# Patient Record
Sex: Female | Born: 1975 | Race: White | Hispanic: No | Smoking: Current every day smoker
Health system: Southern US, Community
[De-identification: ages and names within clinical notes are randomized; demographics above are authoritative.]

## PROBLEM LIST (undated history)

## (undated) DIAGNOSIS — Z22322 Carrier or suspected carrier of Methicillin resistant Staphylococcus aureus: Secondary | ICD-10-CM

## (undated) HISTORY — PX: APPENDECTOMY: SHX54

---

## 2007-04-29 ENCOUNTER — Inpatient Hospital Stay: Payer: Self-pay | Admitting: Internal Medicine

## 2007-11-13 ENCOUNTER — Inpatient Hospital Stay (HOSPITAL_COMMUNITY): Admission: EM | Admit: 2007-11-13 | Discharge: 2007-11-17 | Payer: Self-pay | Admitting: Emergency Medicine

## 2008-01-28 ENCOUNTER — Encounter: Admission: RE | Admit: 2008-01-28 | Discharge: 2008-01-28 | Payer: Self-pay | Admitting: Orthopedic Surgery

## 2010-04-06 ENCOUNTER — Emergency Department (HOSPITAL_COMMUNITY): Admission: EM | Admit: 2010-04-06 | Discharge: 2010-04-06 | Payer: Self-pay | Admitting: Emergency Medicine

## 2010-04-10 ENCOUNTER — Emergency Department (HOSPITAL_COMMUNITY): Admission: EM | Admit: 2010-04-10 | Discharge: 2010-04-10 | Payer: Self-pay | Admitting: Emergency Medicine

## 2010-09-27 LAB — DIFFERENTIAL
Basophils Absolute: 0 10*3/uL (ref 0.0–0.1)
Basophils Relative: 0 % (ref 0–1)
Eosinophils Absolute: 0.3 10*3/uL (ref 0.0–0.7)
Eosinophils Relative: 5 % (ref 0–5)
Lymphocytes Relative: 25 % (ref 12–46)

## 2010-09-27 LAB — URINALYSIS, ROUTINE W REFLEX MICROSCOPIC
Ketones, ur: NEGATIVE mg/dL
Leukocytes, UA: NEGATIVE
Nitrite: NEGATIVE
Protein, ur: NEGATIVE mg/dL
Urobilinogen, UA: 0.2 mg/dL (ref 0.0–1.0)

## 2010-09-27 LAB — BASIC METABOLIC PANEL
CO2: 22 mEq/L (ref 19–32)
GFR calc Af Amer: >60 mL/min (ref >60)
Glucose, Bld: 104 mg/dL — ABNORMAL HIGH (ref 70–99)
Potassium: 3.6 mEq/L (ref 3.5–5.1)
Sodium: 141 mEq/L (ref 135–145)

## 2010-09-27 LAB — CBC
MCHC: 35.8 g/dL (ref 30.0–36.0)
MCV: 88 fL (ref 78.0–100.0)
Platelets: 196 10*3/uL (ref 150–400)
RDW: 13.1 % (ref 11.5–15.5)
WBC: 6.6 10*3/uL (ref 4.0–10.5)
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
## 2011-07-10 ENCOUNTER — Observation Stay (HOSPITAL_COMMUNITY)
Admission: EM | Admit: 2011-07-10 | Discharge: 2011-07-11 | Disposition: A | Discharge status: Home / Self Care | Payer: Medicaid Other | Attending: Emergency Medicine | Admitting: Emergency Medicine

## 2011-07-10 ENCOUNTER — Encounter: Payer: Self-pay | Admitting: Emergency Medicine

## 2011-07-10 DIAGNOSIS — F17200 Nicotine dependence, unspecified, uncomplicated: Secondary | ICD-10-CM | POA: Insufficient documentation

## 2011-07-10 DIAGNOSIS — L02219 Cutaneous abscess of trunk, unspecified: Principal | ICD-10-CM | POA: Insufficient documentation

## 2011-07-10 DIAGNOSIS — L0390 Cellulitis, unspecified: Secondary | ICD-10-CM

## 2011-07-10 DIAGNOSIS — A4902 Methicillin resistant Staphylococcus aureus infection, unspecified site: Secondary | ICD-10-CM | POA: Insufficient documentation

## 2011-07-10 HISTORY — DX: Carrier or suspected carrier of methicillin resistant Staphylococcus aureus: Z22.322

## 2011-07-10 NOTE — ED Notes (Signed)
PT. REPORTS PROGRESSING ABSCESS AT UPPER BACK FOR 4 DAYS WITH NO DRAINAGE. , DENIES FEVER OR CHILLS.

## 2011-07-11 LAB — BASIC METABOLIC PANEL
BUN: 10 mg/dL (ref 6–23)
Calcium: 9.5 mg/dL (ref 8.4–10.5)
Creatinine, Ser: 0.49 mg/dL — ABNORMAL LOW (ref 0.50–1.10)
GFR calc Af Amer: >90 mL/min (ref >90)
GFR calc non Af Amer: >90 mL/min (ref >90)

## 2011-07-11 LAB — CBC
HCT: 40.2 % (ref 36.0–46.0)
Hemoglobin: 14.3 g/dL (ref 12.0–15.0)
MCH: 31.8 pg (ref 26.0–34.0)
MCHC: 35.6 g/dL (ref 30.0–36.0)
RBC: 4.5 MIL/uL (ref 3.87–5.11)

## 2011-07-11 MED ORDER — DIPHENHYDRAMINE HCL 50 MG/ML IJ SOLN
INTRAMUSCULAR | Status: AC
  Administered 2011-07-11: 05:00:00
  Filled 2011-07-11: qty 1

## 2011-07-11 MED ORDER — HYDROMORPHONE HCL PF 1 MG/ML IJ SOLN
0.5000 mg | Freq: Once | INTRAMUSCULAR | Status: AC
  Administered 2011-07-11: 0.5 mg via INTRAVENOUS
  Filled 2011-07-11: qty 1

## 2011-07-11 MED ORDER — CEPHALEXIN 500 MG PO CAPS: 500.0000 mg | ORAL_CAPSULE | Freq: Four times a day (QID) | ORAL | Status: AC

## 2011-07-11 MED ORDER — SODIUM CHLORIDE 0.9 % IV SOLN
20.0000 mL | INTRAVENOUS | Status: DC
  Administered 2011-07-11: 20 mL via INTRAVENOUS

## 2011-07-11 MED ORDER — ONDANSETRON 4 MG PO TBDP
4.0000 mg | ORAL_TABLET | Freq: Once | ORAL | Status: AC
  Administered 2011-07-11: 4 mg via ORAL
  Filled 2011-07-11: qty 1

## 2011-07-11 MED ORDER — ONDANSETRON HCL 4 MG/2ML IJ SOLN
4.0000 mg | Freq: Four times a day (QID) | INTRAMUSCULAR | Status: DC | PRN
  Administered 2011-07-11: 4 mg via INTRAVENOUS
  Filled 2011-07-11: qty 2

## 2011-07-11 MED ORDER — SODIUM CHLORIDE 0.9 % IV SOLN
Freq: Once | INTRAVENOUS | Status: AC
  Administered 2011-07-11: 15:00:00 via INTRAVENOUS

## 2011-07-11 MED ORDER — VANCOMYCIN HCL IN DEXTROSE 1-5 GM/200ML-% IV SOLN
1000.0000 mg | Freq: Two times a day (BID) | INTRAVENOUS | Status: DC
  Administered 2011-07-11 (×2): 1000 mg via INTRAVENOUS
  Filled 2011-07-11 (×2): qty 200

## 2011-07-11 MED ORDER — OXYCODONE-ACETAMINOPHEN 5-325 MG PO TABS: 1.0000 | ORAL_TABLET | Freq: Four times a day (QID) | ORAL | Status: AC | PRN

## 2011-07-11 MED ORDER — FENTANYL CITRATE 0.05 MG/ML IJ SOLN
50.0000 ug | INTRAMUSCULAR | Status: DC | PRN
  Administered 2011-07-11 (×3): 50 ug via INTRAVENOUS
  Filled 2011-07-11 (×3): qty 2

## 2011-07-11 MED ORDER — DIPHENHYDRAMINE HCL 50 MG/ML IJ SOLN
INTRAMUSCULAR | Status: AC
  Filled 2011-07-11: qty 1

## 2011-07-11 MED ORDER — DIPHENHYDRAMINE HCL 50 MG/ML IJ SOLN
25.0000 mg | Freq: Once | INTRAMUSCULAR | Status: AC
  Administered 2011-07-11: 25 mg via INTRAVENOUS

## 2011-07-11 MED ORDER — SULFAMETHOXAZOLE-TRIMETHOPRIM 800-160 MG PO TABS: 1.0000 | ORAL_TABLET | Freq: Two times a day (BID) | ORAL | Status: AC

## 2011-07-11 NOTE — ED Notes (Signed)
Lunch tray to pt.

## 2011-07-11 NOTE — ED Notes (Signed)
Per Langley Adie, PA, administer Benadryl 25mg  IV prior to administration of Vancomycin.

## 2011-07-11 NOTE — ED Provider Notes (Signed)
She is resting. Went over care plan to observe until second dose antibiotics around 3:00 this afternoon. She understands and agrees.   13:15: sHE HAS BEEN MODERATELY CONTROLLED PAIN WISE WITH FENTANYL BUT NEVER PAIN FREE. WILL GIVE IV DILAUDID AND THEN TRANSITION TO ORAL PAIN MEDICATIONS. PATIENT AWARE OF PLAN. CELLULITIC AREA IMPROVED WITH LESS REDNESS.   Rodena Medin, PA 07/11/11 1410

## 2011-07-11 NOTE — Progress Notes (Signed)
Observation review is complete. 

## 2011-07-11 NOTE — Progress Notes (Signed)
Per Dr. Jacky Kindle on 07/11/2011 at 11:15am, patient meets criteria for observation status.

## 2011-07-11 NOTE — ED Provider Notes (Addendum)
History     CSN: [id number]  Arrival date & time 07/10/11  2104   First MD Initiated Contact with Patient 07/11/11 0247      Chief Complaint  Patient presents with  . Abscess    (Consider location/radiation/quality/duration/timing/severity/associated sxs/prior treatment) HPI This is a 35 year old white female with a history of MRSA infection. She is here with an tender swollen area intermittent upper back. It began about 4 days ago and has gotten progressively larger. There is moderate to severe pain associated with it, worse with movement or palpation. There is some erythema. There's been minimal drainage. There's been no fevers or chills. There's been no nausea or vomiting but she has had diarrhea. She is placed salt on it and attempted drawnout without success.  Past Medical History  Diagnosis Date  . MRSA (methicillin resistant staph aureus) culture positive     Past Surgical History  Procedure Date  . Appendectomy   . Cesarean section     No family history on file.  History  Substance Use Topics  . Smoking status: Current Everyday Smoker  . Smokeless tobacco: Not on file  . Alcohol Use: Yes    OB History    Grav Para Term Preterm Abortions TAB SAB Ect Mult Living                  Review of Systems  All other systems reviewed and are negative.    Allergies  Codeine; Hydrocodone; and Iohexol  Home Medications   Current Outpatient Rx  Name Route Sig Dispense Refill  . ASPIRIN-ACETAMINOPHEN-CAFFEINE 250-250-65 MG PO TABS Oral Take 1 tablet by mouth every 6 (six) hours as needed. For menstrual cramps     . DOXYLAMINE SUCCINATE (SLEEP) 25 MG PO TABS Oral Take 25 mg by mouth at bedtime as needed. For sleep       BP 113/61  Pulse 89  Temp(Src) 98.5 F (36.9 C) (Oral)  Resp 16  SpO2 98%  LMP 06/19/2011  Physical Exam General: Well-developed, well-nourished female in no acute distress; appearance consistent with age of record HENT: normocephalic,  atraumatic Eyes: pupils equal round and reactive to light; extraocular muscles intact Neck: supple Heart: regular rate and rhythm Lungs: clear to auscultation bilaterally Abdomen: soft; nontender; nondistended Extremities: No deformity; full range of motion Neurologic: Awake, alert and oriented; motor function intact in all extremities and symmetric; no facial droop; normal coordination speech Skin: Tender, indurated area mid upper back just to the right of the spine; there is a central punctum without drainage; there is no fluctuance; there is mild erythema extending approximately 8 x 10 cm; no pus pockets seen on bedside Psychiatric: Normal mood and affect    ED Course  Procedures (including critical care time)    MDM   We'll start vancomycin in place in CDU on cellulitis protocol.  Nursing notes and vitals signs, including pulse oximetry, reviewed.  Summary of this visit's results, reviewed by myself:  Labs:  Results for orders placed during the hospital encounter of 07/10/11  CBC      Component Value Range   WBC 13.5 (*) 4.0 - 10.5 (K/uL)   RBC 4.50  3.87 - 5.11 (MIL/uL)   Hemoglobin 14.3  12.0 - 15.0 (g/dL)   HCT 78.2  95.6 - 21.3 (%)   MCV 89.3  78.0 - 100.0 (fL)   MCH 31.8  26.0 - 34.0 (pg)   MCHC 35.6  30.0 - 36.0 (g/dL)   RDW 08.6  57.8 -  15.5 (%)   Platelets 179  150 - 400 (K/uL)  BASIC METABOLIC PANEL      Component Value Range   Sodium 137  135 - 145 (mEq/L)   Potassium 3.6  3.5 - 5.1 (mEq/L)   Chloride 100  96 - 112 (mEq/L)   CO2 25  19 - 32 (mEq/L)   Glucose, Bld 97  70 - 99 (mg/dL)   BUN 10  6 - 23 (mg/dL)   Creatinine, Ser 4.09 (*) 0.50 - 1.10 (mg/dL)   Calcium 9.5  8.4 - 81.1 (mg/dL)   GFR calc non Af Amer >90  >90 (mL/min)   GFR calc Af Amer >90  >90 (mL/min)         Hanley Seamen, MD 07/11/11 9147  Hanley Seamen, MD 07/11/11 8295

## 2011-07-11 NOTE — ED Notes (Signed)
Breakfast tray ordered 

## 2011-07-12 NOTE — ED Provider Notes (Signed)
Medical screening examination/treatment/procedure(s) were performed by non-physician practitioner and as supervising physician I was immediately available for consultation/collaboration. Hadlei Stitt Y.   Gavin Pound. Oletta Lamas, MD 07/12/11 [phone]

## 2012-08-03 ENCOUNTER — Emergency Department (HOSPITAL_COMMUNITY): Payer: Medicaid Other

## 2012-08-03 ENCOUNTER — Other Ambulatory Visit: Payer: Self-pay

## 2012-08-03 ENCOUNTER — Emergency Department (HOSPITAL_COMMUNITY)
Admission: EM | Admit: 2012-08-03 | Discharge: 2012-08-03 | Disposition: A | Discharge status: Home / Self Care | Payer: Medicaid Other | Attending: Emergency Medicine | Admitting: Emergency Medicine

## 2012-08-03 ENCOUNTER — Encounter (HOSPITAL_COMMUNITY): Payer: Self-pay | Admitting: Family Medicine

## 2012-08-03 DIAGNOSIS — Z8614 Personal history of Methicillin resistant Staphylococcus aureus infection: Secondary | ICD-10-CM | POA: Insufficient documentation

## 2012-08-03 DIAGNOSIS — M940 Chondrocostal junction syndrome [Tietze]: Secondary | ICD-10-CM | POA: Insufficient documentation

## 2012-08-03 DIAGNOSIS — F17200 Nicotine dependence, unspecified, uncomplicated: Secondary | ICD-10-CM | POA: Insufficient documentation

## 2012-08-03 DIAGNOSIS — R0789 Other chest pain: Secondary | ICD-10-CM | POA: Insufficient documentation

## 2012-08-03 DIAGNOSIS — R509 Fever, unspecified: Secondary | ICD-10-CM | POA: Insufficient documentation

## 2012-08-03 DIAGNOSIS — Z79899 Other long term (current) drug therapy: Secondary | ICD-10-CM | POA: Insufficient documentation

## 2012-08-03 DIAGNOSIS — R0602 Shortness of breath: Secondary | ICD-10-CM | POA: Insufficient documentation

## 2012-08-03 DIAGNOSIS — R110 Nausea: Secondary | ICD-10-CM | POA: Insufficient documentation

## 2012-08-03 LAB — BASIC METABOLIC PANEL
BUN: 9 mg/dL (ref 6–23)
GFR calc Af Amer: >90 mL/min (ref >90)
GFR calc non Af Amer: >90 mL/min (ref >90)
Potassium: 3.4 mEq/L — ABNORMAL LOW (ref 3.5–5.1)
Sodium: 140 mEq/L (ref 135–145)

## 2012-08-03 LAB — CBC
HCT: 37.2 % (ref 36.0–46.0)
MCHC: 36 g/dL (ref 30.0–36.0)
Platelets: 197 10*3/uL (ref 150–400)
RDW: 13.1 % (ref 11.5–15.5)
WBC: 9 10*3/uL (ref 4.0–10.5)

## 2012-08-03 LAB — POCT I-STAT TROPONIN I: Troponin i, poc: 0 ng/mL (ref 0.00–0.08)

## 2012-08-03 MED ORDER — KETOROLAC TROMETHAMINE 60 MG/2ML IM SOLN
60.0000 mg | Freq: Once | INTRAMUSCULAR | Status: AC
  Administered 2012-08-03: 60 mg via INTRAMUSCULAR
  Filled 2012-08-03: qty 2

## 2012-08-03 NOTE — ED Provider Notes (Signed)
History     CSN: [id number]  Arrival date & time 08/03/12  1409   First MD Initiated Contact with Patient 08/03/12 1507      Chief Complaint  Patient presents with  . chest swelling     (Consider location/radiation/quality/duration/timing/severity/associated sxs/prior treatment) HPI Comments: 37 year old female presents to the emergency department complaining of "chest swelling" worsening over the past month. States she had a cold about one month ago which has since subsided on its own, however she has had recurrent "chest swelling" in pain intermittently since. Describes the pain as a "pressure as if someone is sitting on her chest" rated 3/10, worse with doing housework. Pain located in the midsternal region radiating to her right shoulder and arm today. Admits to associated shortness of breath on exertion yesterday and today along with fever and chills. Herbal teas provide no relief. 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 She quit smoking a little over a month ago. Her mom recently passed away a heart attack at age 9, and her heart disease was diagnosed before the age of 77. Patient denies any significant past medical history. No recent long car rides or plane rides   The history is provided by the patient.    Past Medical History  Diagnosis Date  . MRSA (methicillin resistant staph aureus) culture positive     Past Surgical History  Procedure Date  . Appendectomy   . Cesarean section     History reviewed. No pertinent family history.  History  Substance Use Topics  . Smoking status: Current Every Day Smoker  . Smokeless tobacco: Not on file  . Alcohol Use: Yes    OB History    Grav Para Term Preterm Abortions TAB SAB Ect Mult Living                  Review of Systems  Constitutional: Positive for fever and chills.  HENT: Negative for congestion, neck pain and neck stiffness.   Eyes: Negative for visual disturbance.    Respiratory: Positive for chest tightness and shortness of breath.   Cardiovascular: Positive for chest pain. Negative for palpitations and leg swelling.  Gastrointestinal: Positive for nausea. Negative for abdominal pain.  Genitourinary: Negative.   Musculoskeletal: Negative for back pain.  Skin: Negative for rash.  Neurological: Negative for weakness.  Psychiatric/Behavioral: Negative for confusion.    Allergies  Codeine; Hydrocodone; and Iohexol  Home Medications   Current Outpatient Rx  Name  Route  Sig  Dispense  Refill  . PAMPRIN MAX PO   Oral   Take 1 tablet by mouth 2 (two) times daily as needed. For cramps         . VITAMIN C 500 MG PO TABS   Oral   Take 500 mg by mouth daily.         Marland Kitchen ZINC 100 MG PO TABS   Oral   Take 1 tablet by mouth daily.           BP 134/95  Pulse 54  Temp 98 F (36.7 C) (Oral)  Resp 15  SpO2 98%  LMP 08/03/2012  Physical Exam  Nursing note and vitals reviewed. Constitutional: She is oriented to person, place, and time. She appears well-developed.       Overweight  HENT:  Head: Normocephalic and atraumatic.  Mouth/Throat: Oropharynx is clear and moist.  Eyes: Conjunctivae normal and EOM are normal. Pupils are equal, round, and  reactive to light.  Neck: Normal range of motion. Neck supple.  Cardiovascular: Normal rate, regular rhythm, normal heart sounds and intact distal pulses.        No extremity edema.  Pulmonary/Chest: Effort normal and breath sounds normal. She has no wheezes. She has no rhonchi. She has no rales. She exhibits tenderness. She exhibits no edema.    Abdominal: Soft. Bowel sounds are normal. There is no tenderness.  Musculoskeletal: Normal range of motion. She exhibits no edema.  Neurological: She is alert and oriented to person, place, and time.  Skin: Skin is warm and dry. No rash noted.  Psychiatric: She has a normal mood and affect. Her behavior is normal.    ED Course  Procedures (including  critical care time)  Labs Reviewed  BASIC METABOLIC PANEL - Abnormal; Notable for the following:    Potassium 3.4 (*)     All other components within normal limits  CBC  POCT I-STAT TROPONIN I   Dg Chest 2 View  08/03/2012  *RADIOLOGY REPORT*  Clinical Data: Cough and congestion with intermittent fever and difficulty breathing.  CHEST - 2 VIEW  Comparison: None.  Findings: Trachea is midline.  Heart size normal.  Lungs are clear. No pleural fluid.  IMPRESSION: No acute findings.   Original Report Authenticated By: Leanna Battles, M.D.      Date: 08/03/2012  Rate: 64  Rhythm: normal sinus rhythm  QRS Axis: normal  Intervals: normal  ST/T Wave abnormalities: normal  Conduction Disutrbances:none  Narrative Interpretation: sinus rhythm, no stemi  Old EKG Reviewed: none available   1. Costochondritis       MDM  37 y/o female with costochondritis. Chest tenderness to palpation. PERC negative- no concern for PE. Cardiac workup performed due to nature of pain and family hx which was negative. 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 EKG and CXR unremarkable. Pain to palpation improved with toradol. Advised ibuprofen, rest and ice. Return precautions discussed. Patient states understanding of plan and is agreeable.         Trevor Mace, PA-C 08/03/12 1658

## 2012-08-03 NOTE — ED Provider Notes (Signed)
Medical screening examination/treatment/procedure(s) were performed by non-physician practitioner and as supervising physician I was immediately available for consultation/collaboration.   Richardean Canal, MD 08/03/12 [phone]

## 2012-08-03 NOTE — ED Notes (Addendum)
Per pt sts chest swelling that has been going on for a couple of months. sts that it is spreading and getting worse causing pain. sts she is week and doing any normal household routine makes her exhausted. sts when she walks she gets lightheaded.

## 2013-02-24 ENCOUNTER — Encounter (HOSPITAL_COMMUNITY): Payer: Self-pay | Admitting: Emergency Medicine

## 2013-02-24 ENCOUNTER — Emergency Department (HOSPITAL_COMMUNITY)
Admission: EM | Admit: 2013-02-24 | Discharge: 2013-02-24 | Disposition: A | Discharge status: Home / Self Care | Payer: Medicaid Other | Attending: Emergency Medicine | Admitting: Emergency Medicine

## 2013-02-24 DIAGNOSIS — Y939 Activity, unspecified: Secondary | ICD-10-CM | POA: Insufficient documentation

## 2013-02-24 DIAGNOSIS — T6391XA Toxic effect of contact with unspecified venomous animal, accidental (unintentional), initial encounter: Secondary | ICD-10-CM | POA: Insufficient documentation

## 2013-02-24 DIAGNOSIS — L299 Pruritus, unspecified: Secondary | ICD-10-CM | POA: Insufficient documentation

## 2013-02-24 DIAGNOSIS — Y929 Unspecified place or not applicable: Secondary | ICD-10-CM | POA: Insufficient documentation

## 2013-02-24 DIAGNOSIS — R21 Rash and other nonspecific skin eruption: Secondary | ICD-10-CM | POA: Insufficient documentation

## 2013-02-24 DIAGNOSIS — W57XXXA Bitten or stung by nonvenomous insect and other nonvenomous arthropods, initial encounter: Secondary | ICD-10-CM

## 2013-02-24 DIAGNOSIS — F17200 Nicotine dependence, unspecified, uncomplicated: Secondary | ICD-10-CM | POA: Insufficient documentation

## 2013-02-24 DIAGNOSIS — Z8614 Personal history of Methicillin resistant Staphylococcus aureus infection: Secondary | ICD-10-CM | POA: Insufficient documentation

## 2013-02-24 DIAGNOSIS — R110 Nausea: Secondary | ICD-10-CM | POA: Insufficient documentation

## 2013-02-24 DIAGNOSIS — T7840XA Allergy, unspecified, initial encounter: Secondary | ICD-10-CM

## 2013-02-24 DIAGNOSIS — IMO0001 Reserved for inherently not codable concepts without codable children: Secondary | ICD-10-CM | POA: Insufficient documentation

## 2013-02-24 DIAGNOSIS — Z79899 Other long term (current) drug therapy: Secondary | ICD-10-CM | POA: Insufficient documentation

## 2013-02-24 MED ORDER — EPINEPHRINE 0.3 MG/0.3ML IJ SOAJ: 0.3000 mg | INTRAMUSCULAR | Status: AC | PRN

## 2013-02-24 NOTE — ED Notes (Signed)
Pt given d/c teaching. Pt alert and mentating appropriately upon d/c. Airway remains in tact and pt educated on need to use epi pen and when to return to ER. Pt respirations equal and unlabored. NAD noted upon d/c. Pt ambulatory with steady gait. Pt leaving with d/c teaching and has no further questions upon d/c. Pt leaving with paperwork.

## 2013-02-24 NOTE — ED Notes (Addendum)
Stung on left middle finger and 1 hour later she has head spiining and rash  And her stomach feels funny did take childrens benadryle 2 tabs no difficulty breathing or swollowing at this time

## 2013-02-24 NOTE — ED Provider Notes (Signed)
CSN: [id number]     Arrival date & time 02/24/13  1514 History     First MD Initiated Contact with Patient 02/24/13 1605     Chief Complaint  Patient presents with  . Allergic Reaction   (Consider location/radiation/quality/duration/timing/severity/associated sxs/prior Treatment) Patient is a 37 y.o. female presenting with allergic reaction. The history is provided by the patient.  Allergic Reaction Presenting symptoms: itching and rash   Presenting symptoms: no difficulty breathing, no difficulty swallowing, no swelling and no wheezing   Rash:    Location:  Arm   Quality: itchiness and redness   Severity:  Mild Prior allergic episodes:  No prior episodes Context: insect bite/sting   Relieved by:  Antihistamines Worsened by:  Nothing tried Ineffective treatments:  None tried   Past Medical History  Diagnosis Date  . MRSA (methicillin resistant staph aureus) culture positive    Past Surgical History  Procedure Laterality Date  . Appendectomy    . Cesarean section     History reviewed. No pertinent family history. History  Substance Use Topics  . Smoking status: Current Every Day Smoker  . Smokeless tobacco: Not on file  . Alcohol Use: Yes   OB History   Grav Para Term Preterm Abortions TAB SAB Ect Mult Living                 Review of Systems  Constitutional: Negative for fever, chills, diaphoresis and fatigue.  HENT: Negative for ear pain, congestion, sore throat, facial swelling, mouth sores, trouble swallowing, neck pain and neck stiffness.   Eyes: Negative.   Respiratory: Negative for apnea, cough, chest tightness, shortness of breath and wheezing.   Cardiovascular: Negative for chest pain, palpitations and leg swelling.  Gastrointestinal: Positive for nausea. Negative for vomiting, abdominal pain, diarrhea and abdominal distention.  Genitourinary: Negative for hematuria, flank pain, vaginal discharge, difficulty urinating and menstrual problem.    Musculoskeletal: Negative for back pain and gait problem.  Skin: Positive for itching and rash. Negative for wound.  Neurological: Negative for dizziness, tremors, seizures, syncope, facial asymmetry, numbness and headaches.  Psychiatric/Behavioral: Negative.   All other systems reviewed and are negative.    Allergies  Codeine; Hydrocodone; and Iohexol  Home Medications   Current Outpatient Rx  Name  Route  Sig  Dispense  Refill  . Aspirin-Acetaminophen-Caffeine (PAMPRIN MAX PO)   Oral   Take 1 tablet by mouth 2 (two) times daily as needed (cramps). For cramps         . DiphenhydrAMINE HCl (BENADRYL ALLERGY CHILDRENS PO)   Oral   Take 30 mL by mouth daily as needed (allergy).         . Famotidine (PEPCID PO)   Oral   Take 1 tablet by mouth daily.         . Melatonin 5 MG CAPS   Oral   Take 5 mg by mouth at bedtime.         Marland Kitchen OVER THE COUNTER MEDICATION   Oral   Take 1 tablet by mouth daily.         . vitamin B-12 (CYANOCOBALAMIN) 500 MCG tablet   Oral   Take 1,000 mcg by mouth daily.         . vitamin C (ASCORBIC ACID) 500 MG tablet   Oral   Take 500 mg by mouth daily.         . Zinc 100 MG TABS   Oral   Take 1 tablet by mouth daily.         Marland Kitchen  EPINEPHrine (EPIPEN) 0.3 mg/0.3 mL SOAJ injection   Intramuscular   Inject 0.3 mL (0.3 mg total) into the muscle as needed.   2 Device   0    BP 141/85  Pulse 82  Temp(Src) 98.1 F (36.7 C) (Oral)  Resp 18  SpO2 99% Physical Exam  Nursing note and vitals reviewed. Constitutional: She is oriented to person, place, and time. She appears well-developed and well-nourished. No distress.  HENT:  Head: Normocephalic and atraumatic.  Right Ear: External ear normal.  Left Ear: External ear normal.  Nose: Nose normal.  Mouth/Throat: Oropharynx is clear and moist. No oropharyngeal exudate.  Eyes: Conjunctivae and EOM are normal. Pupils are equal, round, and reactive to light. Right eye exhibits no  discharge. Left eye exhibits no discharge.  Neck: Normal range of motion. Neck supple. No JVD present. No tracheal deviation present. No thyromegaly present.  Cardiovascular: Normal rate, regular rhythm, normal heart sounds and intact distal pulses.  Exam reveals no gallop and no friction rub.   No murmur heard. Pulmonary/Chest: Effort normal and breath sounds normal. No respiratory distress. She has no wheezes. She has no rales. She exhibits no tenderness.  Abdominal: Soft. Bowel sounds are normal. She exhibits no distension. There is no tenderness. There is no rebound and no guarding.  Musculoskeletal: Normal range of motion.  Lymphadenopathy:    She has no cervical adenopathy.  Neurological: She is alert and oriented to person, place, and time. No cranial nerve deficit. Coordination normal.  Skin: Skin is warm. Rash noted. Rash is urticarial. She is not diaphoretic.     Mild urticarial rash to the forearm in the area indicated  Psychiatric: She has a normal mood and affect. Her behavior is normal. Judgment and thought content normal.    ED Course   Procedures (including critical care time)  Labs Reviewed - No data to display No results found. 1. Allergic reaction, initial encounter   2. Insect bite     MDM  37 yr old F pt here with urticarial rash to the forearm along with nausea after being stung by an unknown kind of flying insect. Patient says it has been about 4 hours since incident. Did not see what kind of bug it was. Has never had a reaction to insect bites in the past. Had small urticarial rash on forearm and was bitten on left middle finger tip. No pain. Has some nausea. Seems to be allergic rxn, but patient took benadryl at home and symptoms have been resolving. Patient now with normal vitals, no stridor or airway issues. Will discharge with epipen.  Case discussed with Dr. Bebe Shaggy.  Sherryl Manges, MD 02/24/13 [phone]

## 2013-02-26 NOTE — ED Provider Notes (Signed)
I have personally seen and examined the patient.  I have discussed the plan of care with the resident.  I have reviewed the documentation on PMH/FH/Soc. History.  I have reviewed the documentation of the resident and agree.  Pt felt improved on my evaluation and she requested d/c home Discussed appropriate use of epipen  Joya Gaskins, MD 02/26/13 0100

## 2014-10-27 ENCOUNTER — Emergency Department (HOSPITAL_COMMUNITY)
Admission: EM | Admit: 2014-10-27 | Discharge: 2014-10-27 | Disposition: A | Discharge status: Home / Self Care | Payer: Self-pay | Attending: Emergency Medicine | Admitting: Emergency Medicine

## 2014-10-27 ENCOUNTER — Encounter (HOSPITAL_COMMUNITY): Payer: Self-pay | Admitting: Emergency Medicine

## 2014-10-27 ENCOUNTER — Emergency Department (HOSPITAL_COMMUNITY): Payer: Medicaid Other

## 2014-10-27 DIAGNOSIS — R109 Unspecified abdominal pain: Secondary | ICD-10-CM

## 2014-10-27 DIAGNOSIS — Z9889 Other specified postprocedural states: Secondary | ICD-10-CM | POA: Insufficient documentation

## 2014-10-27 DIAGNOSIS — Z9089 Acquired absence of other organs: Secondary | ICD-10-CM | POA: Insufficient documentation

## 2014-10-27 DIAGNOSIS — R1031 Right lower quadrant pain: Secondary | ICD-10-CM | POA: Insufficient documentation

## 2014-10-27 DIAGNOSIS — Z8614 Personal history of Methicillin resistant Staphylococcus aureus infection: Secondary | ICD-10-CM | POA: Insufficient documentation

## 2014-10-27 DIAGNOSIS — Z3202 Encounter for pregnancy test, result negative: Secondary | ICD-10-CM | POA: Insufficient documentation

## 2014-10-27 DIAGNOSIS — Z79899 Other long term (current) drug therapy: Secondary | ICD-10-CM | POA: Insufficient documentation

## 2014-10-27 DIAGNOSIS — Z720 Tobacco use: Secondary | ICD-10-CM | POA: Insufficient documentation

## 2014-10-27 LAB — COMPREHENSIVE METABOLIC PANEL
ALK PHOS: 78 U/L (ref 39–117)
ALT: 14 U/L (ref 0–35)
AST: 16 U/L (ref 0–37)
Albumin: 4.1 g/dL (ref 3.5–5.2)
Anion gap: 11 (ref 5–15)
BILIRUBIN TOTAL: 0.3 mg/dL (ref 0.3–1.2)
BUN: 5 mg/dL — AB (ref 6–23)
CHLORIDE: 104 mmol/L (ref 96–112)
CO2: 24 mmol/L (ref 19–32)
CREATININE: 0.64 mg/dL (ref 0.50–1.10)
Calcium: 9.2 mg/dL (ref 8.4–10.5)
Glucose, Bld: 99 mg/dL (ref 70–99)
Potassium: 3.6 mmol/L (ref 3.5–5.1)
Sodium: 139 mmol/L (ref 135–145)
Total Protein: 7.2 g/dL (ref 6.0–8.3)

## 2014-10-27 LAB — URINALYSIS, ROUTINE W REFLEX MICROSCOPIC
Bilirubin Urine: NEGATIVE
GLUCOSE, UA: NEGATIVE mg/dL
HGB URINE DIPSTICK: NEGATIVE
KETONES UR: NEGATIVE mg/dL
LEUKOCYTES UA: NEGATIVE
Nitrite: NEGATIVE
PROTEIN: NEGATIVE mg/dL
Specific Gravity, Urine: 1.018 (ref 1.005–1.030)
UROBILINOGEN UA: 0.2 mg/dL (ref 0.0–1.0)
pH: 8.5 — ABNORMAL HIGH (ref 5.0–8.0)

## 2014-10-27 LAB — CBC WITH DIFFERENTIAL/PLATELET
BASOS ABS: 0 10*3/uL (ref 0.0–0.1)
Basophils Relative: 0 % (ref 0–1)
Eosinophils Absolute: 0.2 10*3/uL (ref 0.0–0.7)
Eosinophils Relative: 2 % (ref 0–5)
HEMATOCRIT: 40.8 % (ref 36.0–46.0)
HEMOGLOBIN: 14 g/dL (ref 12.0–15.0)
LYMPHS ABS: 1.1 10*3/uL (ref 0.7–4.0)
LYMPHS PCT: 12 % (ref 12–46)
MCH: 31.7 pg (ref 26.0–34.0)
MCHC: 34.3 g/dL (ref 30.0–36.0)
MCV: 92.3 fL (ref 78.0–100.0)
MONO ABS: 0.7 10*3/uL (ref 0.1–1.0)
Monocytes Relative: 7 % (ref 3–12)
NEUTROS ABS: 7 10*3/uL (ref 1.7–7.7)
Neutrophils Relative %: 79 % — ABNORMAL HIGH (ref 43–77)
Platelets: 171 10*3/uL (ref 150–400)
RBC: 4.42 MIL/uL (ref 3.87–5.11)
RDW: 13 % (ref 11.5–15.5)
WBC: 9 10*3/uL (ref 4.0–10.5)

## 2014-10-27 LAB — URINE MICROSCOPIC-ADD ON

## 2014-10-27 LAB — PREGNANCY, URINE: Preg Test, Ur: NEGATIVE

## 2014-10-27 LAB — LIPASE, BLOOD: Lipase: 17 U/L (ref 11–59)

## 2014-10-27 MED ORDER — TRAMADOL HCL 50 MG PO TABS: 50.0000 mg | ORAL_TABLET | Freq: Four times a day (QID) | ORAL | Status: AC | PRN

## 2014-10-27 MED ORDER — ONDANSETRON 4 MG PO TBDP: 4.0000 mg | ORAL_TABLET | Freq: Three times a day (TID) | ORAL | Status: DC | PRN

## 2014-10-27 MED ORDER — ONDANSETRON HCL 4 MG/2ML IJ SOLN
4.0000 mg | Freq: Once | INTRAMUSCULAR | Status: AC
  Administered 2014-10-27: 4 mg via INTRAVENOUS
  Filled 2014-10-27: qty 2

## 2014-10-27 MED ORDER — NAPROXEN 500 MG PO TABS: 500.0000 mg | ORAL_TABLET | Freq: Two times a day (BID) | ORAL | Status: DC

## 2014-10-27 MED ORDER — KETOROLAC TROMETHAMINE 30 MG/ML IJ SOLN
30.0000 mg | Freq: Once | INTRAMUSCULAR | Status: AC
  Administered 2014-10-27: 30 mg via INTRAVENOUS
  Filled 2014-10-27: qty 1

## 2014-10-27 MED ORDER — BARIUM SULFATE 2.1 % PO SUSP: 900.0000 mL | ORAL | Status: AC

## 2014-10-27 MED ORDER — MORPHINE SULFATE 2 MG/ML IJ SOLN
2.0000 mg | Freq: Once | INTRAMUSCULAR | Status: AC
  Administered 2014-10-27: 2 mg via INTRAVENOUS
  Filled 2014-10-27: qty 1

## 2014-10-27 MED ORDER — SODIUM CHLORIDE 0.9 % IV BOLUS (SEPSIS)
1000.0000 mL | Freq: Once | INTRAVENOUS | Status: AC
  Administered 2014-10-27: 1000 mL via INTRAVENOUS

## 2014-10-27 MED ORDER — MORPHINE SULFATE 4 MG/ML IJ SOLN
4.0000 mg | Freq: Once | INTRAMUSCULAR | Status: AC
  Administered 2014-10-27: 4 mg via INTRAVENOUS
  Filled 2014-10-27: qty 1

## 2014-10-27 NOTE — Discharge Instructions (Signed)
Please call your doctor for a followup appointment within 24-48 hours. When you talk to your doctor please let them know that you were seen in the emergency department and have them acquire all of your records so that they can discuss the findings with you and formulate a treatment plan to fully care for your new and ongoing problems. ° °Abdominal Pain °Many things can cause abdominal pain. Usually, abdominal pain is not caused by a disease and will improve without treatment. It can often be observed and treated at home. Your health care provider will do a physical exam and possibly order blood tests and X-rays to help determine the seriousness of your pain. However, in many cases, more time must pass before a clear cause of the pain can be found. Before that point, your health care provider may not know if you need more testing or further treatment. °HOME CARE INSTRUCTIONS  °Monitor your abdominal pain for any changes. The following actions may help to alleviate any discomfort you are experiencing: °· Only take over-the-counter or prescription medicines as directed by your health care provider. °· Do not take laxatives unless directed to do so by your health care provider. °· Try a clear liquid diet (broth, tea, or water) as directed by your health care provider. Slowly move to a bland diet as tolerated. °SEEK MEDICAL CARE IF: °· You have unexplained abdominal pain. °· You have abdominal pain associated with nausea or diarrhea. °· You have pain when you urinate or have a bowel movement. °· You experience abdominal pain that wakes you in the night. °· You have abdominal pain that is worsened or improved by eating food. °· You have abdominal pain that is worsened with eating fatty foods. °· You have a fever. °SEEK IMMEDIATE MEDICAL CARE IF:  °· Your pain does not go away within 2 hours. °· You keep throwing up (vomiting). °· Your pain is felt only in portions of the abdomen, such as the right side or the left lower  portion of the abdomen. °· You pass bloody or black tarry stools. °MAKE SURE YOU: °· Understand these instructions.   °· Will watch your condition.   °· Will get help right away if you are not doing well or get worse.   °Document Released: 04/10/2005 Document Revised: 07/06/2013 Document Reviewed: 03/10/2013 °[employer]® Patient Information ©2015 [employer]. This information is not intended to replace advice given to you by your health care provider. Make sure you discuss any questions you have with your health care provider. ° °

## 2014-10-27 NOTE — ED Notes (Signed)
Pt c/o increased pain after ambulating to BR and having a bowel movement

## 2014-10-27 NOTE — ED Notes (Signed)
Tuesday night pt reported LRQ pain; appendix removed when 20. Pain worsening. Dull pain but then will be stabbing. Took a laxative but threw up. Small BM reported.

## 2014-10-27 NOTE — ED Provider Notes (Signed)
CSN: [id number]     Arrival date & time 10/27/14  1401 History   First MD Initiated Contact with Patient 10/27/14 1537     Chief Complaint  Patient presents with  . Abdominal Pain     (Consider location/radiation/quality/duration/timing/severity/associated sxs/prior Treatment) HPI Comments: The patient is a 39 year old female, she has no significant past medical chronic problems. She presents to the hospital after having 2 days of gradual onset, gradually worsening right flank pain some radiation to the right lower quadrant. This is persistent, dull, comes in waves of worsening pain associated with nausea and one episode of vomiting prior to arrival. She denies any overt constipation though she did try to take a laxative earlier today. There is no fevers, no chills, no swelling or rashes. She does report having cloudy-appearing urine which is very abnormal for her. No history of kidney stones, no history of pyelonephritis.  Patient is a 39 y.o. female presenting with abdominal pain. The history is provided by the patient.  Abdominal Pain   Past Medical History  Diagnosis Date  . MRSA (methicillin resistant staph aureus) culture positive    Past Surgical History  Procedure Laterality Date  . Appendectomy    . Cesarean section     History reviewed. No pertinent family history. History  Substance Use Topics  . Smoking status: Current Every Day Smoker  . Smokeless tobacco: Not on file  . Alcohol Use: Yes   OB History    No data available     Review of Systems  Gastrointestinal: Positive for abdominal pain.  All other systems reviewed and are negative.     Allergies  Codeine; Hydrocodone; and Iohexol  Home Medications   Prior to Admission medications   Medication Sig Start Date End Date Taking? Authorizing Provider  Aspirin-Acetaminophen-Caffeine (PAMPRIN MAX PO) Take 1 tablet by mouth 2 (two) times daily as needed (cramps). For cramps   Yes Historical Provider, MD   DiphenhydrAMINE HCl (BENADRYL ALLERGY CHILDRENS PO) Take 30 mL by mouth daily as needed (allergy).   Yes Historical Provider, MD  Famotidine (PEPCID PO) Take 1 tablet by mouth daily.   Yes Historical Provider, MD  FENUGREEK PO Take 1 tablet by mouth daily.   Yes Historical Provider, MD  Ibuprofen 200 MG CAPS Take 800 mg by mouth daily as needed (pain).    Yes Historical Provider, MD  magnesium citrate SOLN Take 1 Bottle by mouth once.   Yes Historical Provider, MD  Melatonin 5 MG CAPS Take 5 mg by mouth at bedtime.   Yes Historical Provider, MD  vitamin C (ASCORBIC ACID) 500 MG tablet Take 500 mg by mouth daily.   Yes Historical Provider, MD  Zinc 100 MG TABS Take 1 tablet by mouth daily.   Yes Historical Provider, MD  EPINEPHrine (EPIPEN) 0.3 mg/0.3 mL SOAJ injection Inject 0.3 mL (0.3 mg total) into the muscle as needed. Patient not taking: Reported on 10/27/2014 02/24/13   Sherryl MangesSamuel Ritter, MD  naproxen (NAPROSYN) 500 MG tablet Take 1 tablet (500 mg total) by mouth 2 (two) times daily with a meal. 10/27/14   Eber HongBrian Idil Maslanka, MD  ondansetron (ZOFRAN ODT) 4 MG disintegrating tablet Take 1 tablet (4 mg total) by mouth every 8 (eight) hours as needed for nausea. 10/27/14   Eber HongBrian Nimsi Males, MD  OVER THE COUNTER MEDICATION Take 1 tablet by mouth daily.    Historical Provider, MD  traMADol (ULTRAM) 50 MG tablet Take 1 tablet (50 mg total) by mouth every 6 (six) hours  as needed. 10/27/14   Eber Hong, MD   BP 122/70 mmHg  Pulse 81  Temp(Src) 98.1 F (36.7 C) (Oral)  Resp 16  SpO2 100%  LMP 10/18/2014 Physical Exam  Constitutional: She appears well-developed and well-nourished. No distress.  HENT:  Head: Normocephalic and atraumatic.  Mouth/Throat: Oropharynx is clear and moist. No oropharyngeal exudate.  Eyes: Conjunctivae and EOM are normal. Pupils are equal, round, and reactive to light. Right eye exhibits no discharge. Left eye exhibits no discharge. No scleral icterus.  Neck: Normal range of motion.  Neck supple. No JVD present. No thyromegaly present.  Cardiovascular: Normal rate, regular rhythm, normal heart sounds and intact distal pulses.  Exam reveals no gallop and no friction rub.   No murmur heard. Pulmonary/Chest: Effort normal and breath sounds normal. No respiratory distress. She has no wheezes. She has no rales.  Abdominal: Soft. Bowel sounds are normal. She exhibits no distension and no mass. There is tenderness ( Tenderness to the right lower quadrant and right midabdomen, CVA tenderness on the right).  Musculoskeletal: Normal range of motion. She exhibits no edema or tenderness.  Lymphadenopathy:    She has no cervical adenopathy.  Neurological: She is alert. Coordination normal.  Skin: Skin is warm and dry. No rash noted. No erythema.  Psychiatric: She has a normal mood and affect. Her behavior is normal.  Nursing note and vitals reviewed.   ED Course  Procedures (including critical care time) Labs Review Labs Reviewed  COMPREHENSIVE METABOLIC PANEL - Abnormal; Notable for the following:    BUN 5 (*)    All other components within normal limits  CBC WITH DIFFERENTIAL/PLATELET - Abnormal; Notable for the following:    Neutrophils Relative % 79 (*)    All other components within normal limits  URINALYSIS, ROUTINE W REFLEX MICROSCOPIC - Abnormal; Notable for the following:    APPearance TURBID (*)    pH 8.5 (*)    All other components within normal limits  URINE MICROSCOPIC-ADD ON - Abnormal; Notable for the following:    Squamous Epithelial / LPF FEW (*)    Bacteria, UA FEW (*)    All other components within normal limits  LIPASE, BLOOD  PREGNANCY, URINE    Imaging Review Ct Abdomen Pelvis Wo Contrast  10/27/2014   CLINICAL DATA:  Acute lower abdominal pain.  EXAM: CT ABDOMEN AND PELVIS WITHOUT CONTRAST  TECHNIQUE: Multidetector CT imaging of the abdomen and pelvis was performed following the standard protocol without IV contrast.  COMPARISON:  CT scan of Nov 13, 2007.  FINDINGS: Visualized lung bases appear normal. No significant osseous abnormality is noted.  No gallstones are noted. No focal abnormality is noted in the liver, spleen or pancreas on these unenhanced images. Adrenal glands and kidneys appear normal. No hydronephrosis or renal obstruction is noted. No renal or ureteral calculi are noted. There is no evidence of bowel obstruction. Uterus and ovaries appear normal. Urinary bladder is decompressed. No significant adenopathy is noted.  IMPRESSION: No acute abnormality seen in the abdomen or pelvis.   Electronically Signed   By: Lupita Raider, M.D.   On: 10/27/2014 21:17      MDM   Final diagnoses:  Abdominal pain    The patient has a rather normal exam except for tenderness of the right lower abdomen. Bedside ultrasound reveals no hydronephrosis, urinalysis appears cloudy but nonbloody, suspect urinary tract infection. She has R he had appendicitis with removal of her appendix in the past, at  this time she will get Zofran, pain medication, gentle fluids and reevaluation after urinalysis. There is no leukocytosis, no renal dysfunction, normal liver function and normal lipase. The patient is in agreement with the plan.  Emergency Focused Ultrasound Exam Limited retroperitoneal ultrasound of kidneys  Performed and interpreted by Dr. Hyacinth Meeker Indication: flank pain Focused abdominal ultrasound with both kidneys imaged in transverse and longitudinal planes in real-time. Interpretation: No hydronephrosis visualized.   Images archived electronically  CT negatuive for acute findings-  Pt stable for d/c.  Informed of results, improved pain at d/c.  Meds given in ED:  Medications  Barium Sulfate 2.1 % SUSP 900 mL (not administered)  ondansetron (ZOFRAN) injection 4 mg (4 mg Intravenous Given 10/27/14 1606)  sodium chloride 0.9 % bolus 1,000 mL (0 mLs Intravenous Stopped 10/27/14 1742)  ketorolac (TORADOL) 30 MG/ML injection 30 mg (30 mg  Intravenous Given 10/27/14 1606)  morphine 2 MG/ML injection 2 mg (2 mg Intravenous Given 10/27/14 1749)  morphine 4 MG/ML injection 4 mg (4 mg Intravenous Given 10/27/14 1954)    New Prescriptions   NAPROXEN (NAPROSYN) 500 MG TABLET    Take 1 tablet (500 mg total) by mouth 2 (two) times daily with a meal.   ONDANSETRON (ZOFRAN ODT) 4 MG DISINTEGRATING TABLET    Take 1 tablet (4 mg total) by mouth every 8 (eight) hours as needed for nausea.   TRAMADOL (ULTRAM) 50 MG TABLET    Take 1 tablet (50 mg total) by mouth every 6 (six) hours as needed.        Eber Hong, MD 10/27/14 [phone]

## 2016-06-06 ENCOUNTER — Emergency Department
Admission: EM | Admit: 2016-06-06 | Discharge: 2016-06-06 | Disposition: A | Discharge status: Home / Self Care | Payer: Medicaid Other | Attending: Emergency Medicine | Admitting: Emergency Medicine

## 2016-06-06 ENCOUNTER — Emergency Department: Payer: Medicaid Other

## 2016-06-06 DIAGNOSIS — Z79899 Other long term (current) drug therapy: Secondary | ICD-10-CM | POA: Insufficient documentation

## 2016-06-06 DIAGNOSIS — Y929 Unspecified place or not applicable: Secondary | ICD-10-CM | POA: Insufficient documentation

## 2016-06-06 DIAGNOSIS — W1839XA Other fall on same level, initial encounter: Secondary | ICD-10-CM | POA: Insufficient documentation

## 2016-06-06 DIAGNOSIS — S8262XA Displaced fracture of lateral malleolus of left fibula, initial encounter for closed fracture: Secondary | ICD-10-CM | POA: Diagnosis not present

## 2016-06-06 DIAGNOSIS — F17200 Nicotine dependence, unspecified, uncomplicated: Secondary | ICD-10-CM | POA: Insufficient documentation

## 2016-06-06 DIAGNOSIS — Y9301 Activity, walking, marching and hiking: Secondary | ICD-10-CM | POA: Diagnosis not present

## 2016-06-06 DIAGNOSIS — Y999 Unspecified external cause status: Secondary | ICD-10-CM | POA: Diagnosis not present

## 2016-06-06 DIAGNOSIS — S99912A Unspecified injury of left ankle, initial encounter: Secondary | ICD-10-CM | POA: Diagnosis present

## 2016-06-06 DIAGNOSIS — S82892A Other fracture of left lower leg, initial encounter for closed fracture: Secondary | ICD-10-CM

## 2016-06-06 DIAGNOSIS — S93402A Sprain of unspecified ligament of left ankle, initial encounter: Secondary | ICD-10-CM

## 2016-06-06 MED ORDER — ONDANSETRON HCL 4 MG/2ML IJ SOLN
4.0000 mg | Freq: Once | INTRAMUSCULAR | Status: AC
  Administered 2016-06-06: 4 mg via INTRAVENOUS
  Filled 2016-06-06: qty 2

## 2016-06-06 MED ORDER — OXYCODONE-ACETAMINOPHEN 5-325 MG PO TABS
1.0000 | ORAL_TABLET | Freq: Once | ORAL | Status: AC
  Administered 2016-06-06: 1 via ORAL
  Filled 2016-06-06: qty 1

## 2016-06-06 MED ORDER — ONDANSETRON 4 MG PO TBDP: 4.0000 mg | ORAL_TABLET | Freq: Four times a day (QID) | ORAL | 0 refills | Status: DC | PRN

## 2016-06-06 MED ORDER — OXYCODONE-ACETAMINOPHEN 5-325 MG PO TABS: 1.0000 | ORAL_TABLET | Freq: Four times a day (QID) | ORAL | 0 refills | Status: AC | PRN

## 2016-06-06 MED ORDER — KETOROLAC TROMETHAMINE 30 MG/ML IJ SOLN
30.0000 mg | Freq: Once | INTRAMUSCULAR | Status: AC
  Administered 2016-06-06: 30 mg via INTRAVENOUS
  Filled 2016-06-06: qty 1

## 2016-06-06 NOTE — ED Triage Notes (Signed)
Pt came to ED via EMS after fall while hiking. C/o right ankle pain, swelling noted. Pt reports heard ankle pop, tried to walk on it after the injury and could not. Pt given 100mcg of fentanyl by EMS. Rates pain 5/10.

## 2016-06-06 NOTE — ED Provider Notes (Signed)
[facility] Provider Note ____________________________________________  Time seen: 1128  I have reviewed the triage vital signs and the nursing notes.  HISTORY  Chief Complaint  Ankle Injury  HPI Cheryl M[facility]onnell is a 40 y.o. female presents to the ED via EMS from [place] local [place]. She sustained a right ankle injury after a trip over a tree stump. She describes rolling on the right ankle in an inversion mechanism, and noted an audible and palpable pop to the lateral right ankle. She presents now with pain and swelling to the distal fibula as well as pain to the proximal fibula. She denies any other injury at this time including head injury, loss of consciousness, or dizziness.  Past Medical History:  Diagnosis Date  . MRSA (methicillin resistant staph aureus) culture positive     There are no active problems to display for this patient.  Past Surgical History:  Procedure Laterality Date  . APPENDECTOMY    . CESAREAN SECTION      Prior to Admission medications   Medication Sig Start Date End Date Taking? Authorizing Provider  Aspirin-Acetaminophen-Caffeine (PAMPRIN MAX PO) Take 1 tablet by mouth 2 (two) times daily as needed (cramps). For cramps    Historical Provider, MD  DiphenhydrAMINE HCl (BENADRYL ALLERGY CHILDRENS PO) Take 30 mL by mouth daily as needed (allergy).    Historical Provider, MD  EPINEPHrine (EPIPEN) 0.3 mg/0.3 mL SOAJ injection Inject 0.3 mL (0.3 mg total) into the muscle as needed. Patient not taking: Reported on 10/27/2014 02/24/13   Cheryl MangesSamuel Ritter, MD  Famotidine (PEPCID PO) Take 1 tablet by mouth daily.    Historical Provider, MD  FENUGREEK PO Take 1 tablet by mouth daily.    Historical Provider, MD  Ibuprofen 200 MG CAPS Take 800 mg by mouth daily as needed (pain).     Historical Provider, MD  magnesium citrate SOLN Take 1 Bottle by mouth once.    Historical Provider, MD  Melatonin 5 MG CAPS Take 5 mg by  mouth at bedtime.    Historical Provider, MD  naproxen (NAPROSYN) 500 MG tablet Take 1 tablet (500 mg total) by mouth 2 (two) times daily with a meal. 10/27/14   Cheryl HongBrian Miller, MD  ondansetron (ZOFRAN ODT) 4 MG disintegrating tablet Take 1 tablet (4 mg total) by mouth every 8 (eight) hours as needed for nausea. 10/27/14   Cheryl HongBrian Miller, MD  ondansetron (ZOFRAN ODT) 4 MG disintegrating tablet Take 1 tablet (4 mg total) by mouth every 6 (six) hours as needed for nausea or vomiting. 06/06/16   [facility] Cheryl Grimsley, PA-C  OVER THE COUNTER MEDICATION Take 1 tablet by mouth daily.    Historical Provider, MD  oxyCODONE-acetaminophen (ROXICET) 5-325 MG tablet Take 1 tablet by mouth every 6 (six) hours as needed for moderate pain or severe pain. 06/06/16   [facility] Taje Tondreau, PA-C  traMADol (ULTRAM) 50 MG tablet Take 1 tablet (50 mg total) by mouth every 6 (six) hours as needed. 10/27/14   Cheryl HongBrian Miller, MD  vitamin C (ASCORBIC ACID) 500 MG tablet Take 500 mg by mouth daily.    Historical Provider, MD  Zinc 100 MG TABS Take 1 tablet by mouth daily.    Historical Provider, MD    Allergies Codeine; Hydrocodone; and Iohexol  No family history on file.  Social History Social History  Substance Use Topics  . Smoking status: Current Every Day Smoker  . Smokeless tobacco: Never Used  . Alcohol use  Yes    Review of Systems  Constitutional: Negative for fever. Cardiovascular: Negative for chest pain. Respiratory: Negative for shortness of breath. Gastrointestinal: Negative for abdominal pain, vomiting and diarrhea. Musculoskeletal: Negative for back pain. Right ankle pain and disability as above. Skin: Negative for rash. Neurological: Negative for headaches, focal weakness or numbness. ____________________________________________  PHYSICAL EXAM:  VITAL SIGNS: ED Triage Vitals [06/06/16 1126]  Enc Vitals Group     BP (!) 126/56     Pulse Rate 72     Resp 16     Temp 98.1 F (36.7 C)      Temp Source Oral     SpO2 97 %     Weight      Height 5\' 4"  (1.626 m)     Head Circumference      Peak Flow      Pain Score      Pain Loc      Pain Edu?      Excl. in GC?     Constitutional: Alert and oriented. Well appearing and in no distress. Head: Normocephalic and atraumatic. Eyes: Conjunctivae are normal. PERRL. Normal extraocular movements Cardiovascular: Normal rate, regular rhythm. Normal distal pulses. Respiratory: Normal respiratory effort. No wheezes/rales/rhonchi. Gastrointestinal: Soft and nontender. No distention. Musculoskeletal: Right ankle with obvious soft tissue swelling and early ecchymosis to the distal fibula. Patient without any gross deformity noted. No calf or Achilles tenderness is appreciated. [name] is tender to the distal third of the fibula laterally. Nontender with normal range of motion in all extremities.  Neurologic:  Normal gross sensation. Normal speech and language. No gross focal neurologic deficits are appreciated. Skin:  Skin is warm, dry and intact. No rash noted. Psychiatric: Mood and affect are normal. Patient exhibits appropriate insight and judgment. ____________________________________________   RADIOLOGY  Right Ankle IMPRESSION: Minimally displaced intra-articular Lateral malleolar  fracture.  I, Cheryl Mcconnell, Cheryl Mcconnell, personally viewed and evaluated these images (plain radiographs) as part of my medical decision making, as well as reviewing the written report by the radiologist. ____________________________________________  PROCEDURES  OCL ankle stirrup splint crutches ____________________________________________  INITIAL IMPRESSION / ASSESSMENT AND PLAN / ED COURSE  Patient with initial fracture management of a close distal fibular fracture or sprain. She is fitted with an OCL ankle stirrup splint and crutches. She is discharged with a prescription for Zofran and Percocet for pain. She is referred to Dr. Deeann SaintHoward Mcconnell for  further fracture management.  Clinical Course    ____________________________________________  FINAL CLINICAL IMPRESSION(S) / ED DIAGNOSES  Final diagnoses:  Closed fracture of left ankle, initial encounter  Sprain of left ankle, unspecified ligament, initial encounter      Lissa HoardJenise V Mcconnell Kelise Kuch, PA-C 06/06/16 1443    Nita Sicklearolina Veronese, MD 06/09/16 2325

## 2016-06-06 NOTE — Discharge Instructions (Signed)
You have been treated for a fracture to the fibula bone of the ankle. Wear the splint until evaluated by ortho. Rest with the leg elevated when seated. Apply ice over the splint to reduce swelling. Take the pain medicine as needed along with OTC ibuprofen or naproxen for non-drowsy pain and inflammation relief. Follow-up with ortho for re-evaluation in 1 week or so. Avoid driving due to the injured/splinted foot.

## 2016-07-31 ENCOUNTER — Ambulatory Visit: Payer: Self-pay

## 2017-11-25 ENCOUNTER — Encounter: Payer: Self-pay | Admitting: Emergency Medicine

## 2017-11-25 ENCOUNTER — Other Ambulatory Visit: Payer: Self-pay

## 2017-11-25 ENCOUNTER — Emergency Department
Admission: EM | Admit: 2017-11-25 | Discharge: 2017-11-25 | Disposition: A | Discharge status: Home / Self Care | Payer: Self-pay | Attending: Emergency Medicine | Admitting: Emergency Medicine

## 2017-11-25 ENCOUNTER — Emergency Department: Payer: Self-pay

## 2017-11-25 DIAGNOSIS — R002 Palpitations: Secondary | ICD-10-CM | POA: Insufficient documentation

## 2017-11-25 DIAGNOSIS — Z79899 Other long term (current) drug therapy: Secondary | ICD-10-CM | POA: Insufficient documentation

## 2017-11-25 DIAGNOSIS — F17200 Nicotine dependence, unspecified, uncomplicated: Secondary | ICD-10-CM | POA: Insufficient documentation

## 2017-11-25 DIAGNOSIS — R0602 Shortness of breath: Secondary | ICD-10-CM | POA: Insufficient documentation

## 2017-11-25 LAB — CBC
HCT: 46.5 % (ref 35.0–47.0)
HEMOGLOBIN: 16.2 g/dL — AB (ref 12.0–16.0)
MCH: 32.9 pg (ref 26.0–34.0)
MCHC: 34.9 g/dL (ref 32.0–36.0)
MCV: 94.2 fL (ref 80.0–100.0)
Platelets: 251 10*3/uL (ref 150–440)
RBC: 4.93 MIL/uL (ref 3.80–5.20)
RDW: 13.8 % (ref 11.5–14.5)
WBC: 8.6 10*3/uL (ref 3.6–11.0)

## 2017-11-25 LAB — FIBRIN DERIVATIVES D-DIMER (ARMC ONLY): Fibrin derivatives D-dimer (ARMC): 290.1 ng/mL (FEU) (ref 0.00–499.00)

## 2017-11-25 LAB — TSH: TSH: 3.616 u[IU]/mL (ref 0.350–4.500)

## 2017-11-25 LAB — BASIC METABOLIC PANEL
ANION GAP: 10 (ref 5–15)
BUN: 7 mg/dL (ref 6–20)
CHLORIDE: 102 mmol/L (ref 101–111)
CO2: 25 mmol/L (ref 22–32)
Calcium: 9.7 mg/dL (ref 8.9–10.3)
Creatinine, Ser: 0.67 mg/dL (ref 0.44–1.00)
GFR calc non Af Amer: >60 mL/min (ref >60)
GLUCOSE: 120 mg/dL — AB (ref 65–99)
Potassium: 3.3 mmol/L — ABNORMAL LOW (ref 3.5–5.1)
Sodium: 137 mmol/L (ref 135–145)

## 2017-11-25 LAB — TROPONIN I

## 2017-11-25 NOTE — ED Triage Notes (Signed)
palpitations and SOB X 4 days. Constant. Hx of panic attacks but states that this feels different.

## 2017-11-25 NOTE — Discharge Instructions (Addendum)
Return to the emergency room for any new or worrisome symptoms including increased palpitations shortness of breath, chest pain or if you feel worse in any way.  Follow closely with cardiology and your primary care doctor.  Avoid caffeine or anything that contains stimulants.

## 2017-11-25 NOTE — ED Provider Notes (Signed)
[facility] Emergency Department Provider Note  ____________________________________________   I have reviewed the triage vital signs and the nursing notes. Where available I have reviewed prior notes and, if possible and indicated, outside hospital notes.    HISTORY  Chief Complaint Palpitations and Shortness of Breath    HPI Cheryl Mcconnell is a 42 y.o. female with a history of panic attacks, mother had heart disease at an advanced age,  no personal or family history of PE or DVT, no recent travel, no recent surgery, not on exogenous estrogens she states, states that she has had palpitations off and on for last 4 days.  Feels like a panic attack but usually does not last this long.  Denies being under any significant stress.  No SI or HI.  Denies any chest pain actually.  Sometimes feels short of breath what is happening.  Last for a few seconds and goes away.  Can happen at rest or while walking.  No exertional symptoms otherwise.  No leg swelling, no fever no chills no cough, she just feels that she goes into irregular heartbeats or skipping beats and has palpitations.  This does feel like a panic attack but feels like is coming and going more frequently than normal.  Has never had a Holter monitor or any cardiac work-up.  Denies any symptoms at this moment feels completely well.  Symptoms have been going on for 4 days.  Nothing else makes it better or worse no treatment,    Past Medical History:  Diagnosis Date  . MRSA (methicillin resistant staph aureus) culture positive     There are no active problems to display for this patient.   Past Surgical History:  Procedure Laterality Date  . APPENDECTOMY    . CESAREAN SECTION      Prior to Admission medications   Medication Sig Start Date End Date Taking? Authorizing Provider  Aspirin-Acetaminophen-Caffeine (PAMPRIN MAX PO) Take 1 tablet by mouth 2 (two) times daily as needed (cramps). For cramps     [provider]  DiphenhydrAMINE HCl (BENADRYL ALLERGY CHILDRENS PO) Take 30 mL by mouth daily as needed (allergy).    [provider]  EPINEPHrine (EPIPEN) 0.3 mg/0.3 mL SOAJ injection Inject 0.3 mL (0.3 mg total) into the muscle as needed. Patient not taking: Reported on 10/27/2014 02/24/13   Sherryl Manges, MD  Famotidine (PEPCID PO) Take 1 tablet by mouth daily.    [provider]  FENUGREEK PO Take 1 tablet by mouth daily.    [provider]  Ibuprofen 200 MG CAPS Take 800 mg by mouth daily as needed (pain).     [provider]  magnesium citrate SOLN Take 1 Bottle by mouth once.    [provider]  Melatonin 5 MG CAPS Take 5 mg by mouth at bedtime.    [provider]  naproxen (NAPROSYN) 500 MG tablet Take 1 tablet (500 mg total) by mouth 2 (two) times daily with a meal. 10/27/14   Eber Hong, MD  ondansetron (ZOFRAN ODT) 4 MG disintegrating tablet Take 1 tablet (4 mg total) by mouth every 8 (eight) hours as needed for nausea. 10/27/14   Eber Hong, MD  ondansetron (ZOFRAN ODT) 4 MG disintegrating tablet Take 1 tablet (4 mg total) by mouth every 6 (six) hours as needed for nausea or vomiting. 06/06/16   Menshew, Charlesetta Ivory, PA-C  OVER THE COUNTER MEDICATION Take 1 tablet by mouth daily.    [provider]  oxyCODONE-acetaminophen (ROXICET) 5-325 MG tablet Take 1 tablet by mouth every 6 (six) hours as needed for moderate pain or severe pain. 06/06/16   Menshew, Charlesetta Ivory, PA-C  traMADol (ULTRAM) 50 MG tablet Take 1 tablet (50 mg total) by mouth every 6 (six) hours as needed. 10/27/14   Eber Hong, MD  vitamin C (ASCORBIC ACID) 500 MG tablet Take 500 mg by mouth daily.    [provider]  Zinc 100 MG TABS Take 1 tablet by mouth daily.    [provider]    Allergies Codeine; Hydrocodone; and Iohexol  No family history on file.  Social History Social History   Tobacco Use  . Smoking  status: Current Every Day Smoker  . Smokeless tobacco: Never Used  Substance Use Topics  . Alcohol use: Yes  . Drug use: No    Review of Systems Constitutional: No fever/chills Eyes: No visual changes. ENT: No sore throat. No stiff neck no neck pain Cardiovascular: Denies chest pain. Respiratory: Denies shortness of breath. Gastrointestinal:   no vomiting.  No diarrhea.  No constipation. Genitourinary: Negative for dysuria. Musculoskeletal: Negative lower extremity swelling Skin: Negative for rash. Neurological: Negative for severe headaches, focal weakness or numbness.   ____________________________________________   PHYSICAL EXAM:  VITAL SIGNS: ED Triage Vitals  Enc Vitals Group     BP 11/25/17 1538 (!) 147/88     Pulse Rate 11/25/17 1538 98     Resp 11/25/17 1538 18     Temp 11/25/17 1538 98.5 F (36.9 C)     Temp Source 11/25/17 1538 Oral     SpO2 11/25/17 1538 100 %     Weight 11/25/17 1534 160 lb (72.6 kg)     Height 11/25/17 1534  (1.626 m)     Head Circumference --      Peak Flow --      Pain Score 11/25/17 1534 0     Pain Loc --      Pain Edu? --      Excl. in GC? --     Constitutional: Alert and oriented. Well appearing and in no acute distress. Eyes: Conjunctivae are normal Head: Atraumatic HEENT: No congestion/rhinnorhea. Mucous membranes are moist.  Oropharynx non-erythematous Neck:   Nontender with no meningismus, no masses, no stridor Cardiovascular: Normal rate, regular rhythm. Grossly normal heart sounds.  Good peripheral circulation. Respiratory: Normal respiratory effort.  No retractions. Lungs CTAB. Abdominal: Soft and nontender. No distention. No guarding no rebound Back:  There is no focal tenderness or step off.  there is no midline tenderness there are no lesions noted. there is no CVA tenderness Musculoskeletal: No lower extremity tenderness, no upper extremity tenderness. No joint effusions, no DVT signs strong distal pulses no  edema Neurologic:  Normal speech and language. No gross focal neurologic deficits are appreciated.  Skin:  Skin is warm, dry and intact. No rash noted. Psychiatric: Mood and affect are normal. Speech and behavior are normal.  ____________________________________________   LABS (all labs ordered are listed, but only abnormal results are displayed)  Labs Reviewed  BASIC METABOLIC PANEL - Abnormal; Notable for the following components:      Result Value   Potassium 3.3 (*)    Glucose, Bld 120 (*)    All other components within normal limits  CBC - Abnormal; Notable for the following components:   Hemoglobin 16.2 (*)    All other components within normal limits  TROPONIN I  FIBRIN DERIVATIVES D-DIMER ([facility] ONLY)  TSH    Pertinent labs  results that were available during my care of the patient were reviewed by me and considered in my medical decision making (see chart for details). ____________________________________________  EKG  I personally interpreted any EKGs ordered by me or triage Normal sinus rhythm, rate 101 mild tachycardia noted, normal axis, sinus rhythm no acute ischemia, nonspecific ST changes ____________________________________________  RADIOLOGY  Pertinent labs & imaging results that were available during my care of the patient were reviewed by me and considered in my medical decision making (see chart for details). If possible, patient and/or family made aware of any abnormal findings.  Dg Chest 2 View  Result Date: 11/25/2017 CLINICAL DATA:  Short of breath palpitations EXAM: CHEST - 2 VIEW COMPARISON:  08/04/2015 FINDINGS: The heart size and mediastinal contours are within normal limits. Both lungs are clear. The visualized skeletal structures are unremarkable. IMPRESSION: No active cardiopulmonary disease. Electronically Signed   By: Marlan Palau M.D.   On: 11/25/2017 16:17   ____________________________________________    PROCEDURES  Procedure(s)  performed: None  Procedures  Critical Care performed: None  ____________________________________________   INITIAL IMPRESSION / ASSESSMENT AND PLAN / ED COURSE  Pertinent labs & imaging results that were available during my care of the patient were reviewed by me and considered in my medical decision making (see chart for details).  Patient here with palpitations essentially off and on for days.  Despite duration of symptoms work-up is completely normal thus far, EKG does not show any acute ischemia, troponin is negative, we will check a TSH and a d-dimer.  She is not PERC negative.  Her heart rate is 101 and not 99 essentially otherwise she would be. If her d-dimer is positive we will unfortunately have to scan I believe, if her d-dimer is negative I do not think it is likely that she has a PE.  We will also check a thyroid and if that is negative, I do not think serial enzymes will be of any utility, as patient has had symptoms for 4 days, I do not think 4 days in 3 hours is likely going to be enough to make the diagnosis.  We will have her follow-up however with a cardiologist for a Holter monitor at that point.   ____________________________________________   FINAL CLINICAL IMPRESSION(S) / ED DIAGNOSES  Final diagnoses:  None      This chart was dictated using voice recognition software.  Despite best efforts to proofread,  errors can occur which can change meaning.      Jeanmarie Plant, MD 11/25/17 [phone]

## 2018-03-23 ENCOUNTER — Emergency Department (HOSPITAL_COMMUNITY)
Admission: EM | Admit: 2018-03-23 | Discharge: 2018-03-23 | Disposition: A | Discharge status: Home / Self Care | Payer: Self-pay | Attending: Emergency Medicine | Admitting: Emergency Medicine

## 2018-03-23 ENCOUNTER — Encounter (HOSPITAL_COMMUNITY): Payer: Self-pay

## 2018-03-23 DIAGNOSIS — F10920 Alcohol use, unspecified with intoxication, uncomplicated: Secondary | ICD-10-CM | POA: Insufficient documentation

## 2018-03-23 DIAGNOSIS — F17210 Nicotine dependence, cigarettes, uncomplicated: Secondary | ICD-10-CM | POA: Insufficient documentation

## 2018-03-23 DIAGNOSIS — T50901A Poisoning by unspecified drugs, medicaments and biological substances, accidental (unintentional), initial encounter: Secondary | ICD-10-CM | POA: Insufficient documentation

## 2018-03-23 DIAGNOSIS — F1290 Cannabis use, unspecified, uncomplicated: Secondary | ICD-10-CM | POA: Insufficient documentation

## 2018-03-23 DIAGNOSIS — Z79899 Other long term (current) drug therapy: Secondary | ICD-10-CM | POA: Insufficient documentation

## 2018-03-23 DIAGNOSIS — Y906 Blood alcohol level of 120-199 mg/100 ml: Secondary | ICD-10-CM | POA: Insufficient documentation

## 2018-03-23 LAB — COMPREHENSIVE METABOLIC PANEL
ALK PHOS: 76 U/L (ref 38–126)
ALT: 15 U/L (ref 0–44)
AST: 21 U/L (ref 15–41)
Albumin: 4.6 g/dL (ref 3.5–5.0)
Anion gap: 12 (ref 5–15)
BUN: 7 mg/dL (ref 6–20)
CALCIUM: 9.4 mg/dL (ref 8.9–10.3)
CO2: 26 mmol/L (ref 22–32)
CREATININE: 0.5 mg/dL (ref 0.44–1.00)
Chloride: 105 mmol/L (ref 98–111)
Glucose, Bld: 110 mg/dL — ABNORMAL HIGH (ref 70–99)
Potassium: 4 mmol/L (ref 3.5–5.1)
Sodium: 143 mmol/L (ref 135–145)
Total Bilirubin: 0.5 mg/dL (ref 0.3–1.2)
Total Protein: 8 g/dL (ref 6.5–8.1)

## 2018-03-23 LAB — CBC
HCT: 44.9 % (ref 36.0–46.0)
HEMOGLOBIN: 15.3 g/dL — AB (ref 12.0–15.0)
MCH: 31.7 pg (ref 26.0–34.0)
MCHC: 34.1 g/dL (ref 30.0–36.0)
MCV: 93.2 fL (ref 78.0–100.0)
Platelets: 281 10*3/uL (ref 150–400)
RBC: 4.82 MIL/uL (ref 3.87–5.11)
RDW: 13.7 % (ref 11.5–15.5)
WBC: 5.8 10*3/uL (ref 4.0–10.5)

## 2018-03-23 LAB — SALICYLATE LEVEL

## 2018-03-23 LAB — CBG MONITORING, ED: GLUCOSE-CAPILLARY: 103 mg/dL — AB (ref 70–99)

## 2018-03-23 LAB — ETHANOL: ALCOHOL ETHYL (B): 137 mg/dL — AB (ref <10)

## 2018-03-23 LAB — ACETAMINOPHEN LEVEL: Acetaminophen (Tylenol), Serum: <10 ug/mL — ABNORMAL LOW (ref 10–30)

## 2018-03-23 LAB — I-STAT BETA HCG BLOOD, ED (MC, WL, AP ONLY): I-stat hCG, quantitative: <5 m[IU]/mL (ref <5)

## 2018-03-23 NOTE — ED Provider Notes (Signed)
[facility]-EMERGENCY DEPT Provider Note   CSN: [id number] Arrival date & time: 03/23/18  0516     History   Chief Complaint Chief Complaint  Patient presents with  . Drug Overdose    HPI Cheryl Mcconnell is a 42 y.o. female presenting via EMS for overdose/lethargy today.  Upon my evaluation patient sitting comfortably in bed, no acute distress, alert and oriented x4.     Patient states that she recently returned from [place] and had a argument with her boyfriend last night.  After the argument she went home to drink a bottle of wine and took valerian root to help her sleep.  Patient states that she took more than her usual dose of valerian root however "just to sleep" denies intention to self-harm.  Patient states that she then received a call from EMS who was tipped off by her boyfriend who was worried about her.  Patient states that she told the EMS to stop calling her and went to sleep.  Patient states that when she awoke EMS was at her door and wanted to take her to the emergency department for her being "too sleepy".  Patient states that she was cooperative with EMS.  Patient states that she has a history of anxiety and anxiety attacks and she takes valerian root normally to help with this and to help her sleep.  Patient states that she took the valerian root to go to sleep because she was "done with today and just wanted to sleep".  Patient denies suicidal ideations or attempting to harm herself with the valerian root/alcohol.   Patient states that she is feeling well at this time, she denies suicidal/homicidal ideations.  HPI  Past Medical History:  Diagnosis Date  . MRSA (methicillin resistant staph aureus) culture positive     There are no active problems to display for this patient.   Past Surgical History:  Procedure Laterality Date  . APPENDECTOMY    . CESAREAN SECTION       OB History   None      Home Medications    Prior to  Admission medications   Medication Sig Start Date End Date Taking? Authorizing Provider  Aspirin-Acetaminophen-Caffeine (PAMPRIN MAX PO) Take 1 tablet by mouth 2 (two) times daily as needed (cramps). For cramps   Yes [provider]  calcium carbonate (TUMS - DOSED IN MG ELEMENTAL CALCIUM) 500 MG chewable tablet Chew 1 tablet by mouth 3 (three) times daily as needed for indigestion or heartburn.   Yes [provider]  Doxylamine Succinate, Sleep, (SLEEP AID PO) Take 4 tablets by mouth at bedtime as needed (sleep).   Yes [provider]  FENUGREEK PO Take 1 tablet by mouth daily.   Yes [provider]  fluticasone (FLONASE) 50 MCG/ACT nasal spray Place 1 spray into both nostrils daily as needed for allergies or rhinitis.   Yes [provider]  Ibuprofen 200 MG CAPS Take 800 mg by mouth every 8 (eight) hours as needed (pain).    Yes [provider]  VALERIAN ROOT PO Take 2 capsules by mouth daily.   Yes [provider]  vitamin C (ASCORBIC ACID) 500 MG tablet Take 500 mg by mouth daily.   Yes [provider]  Zinc 100 MG TABS Take 100 mg by mouth daily.    Yes [provider]  EPINEPHrine (EPIPEN) 0.3 mg/0.3 mL SOAJ injection Inject 0.3 mL (0.3 mg total) into the muscle as needed. 02/24/13  Sherryl Manges, MD  naproxen (NAPROSYN) 500 MG tablet Take 1 tablet (500 mg total) by mouth 2 (two) times daily with a meal. Patient not taking: Reported on 03/23/2018 10/27/14   Eber Hong, MD  ondansetron (ZOFRAN ODT) 4 MG disintegrating tablet Take 1 tablet (4 mg total) by mouth every 8 (eight) hours as needed for nausea. Patient not taking: Reported on 03/23/2018 10/27/14   Eber Hong, MD  ondansetron (ZOFRAN ODT) 4 MG disintegrating tablet Take 1 tablet (4 mg total) by mouth every 6 (six) hours as needed for nausea or vomiting. Patient not taking: Reported on 03/23/2018 06/06/16   Menshew, Charlesetta Ivory, PA-C    oxyCODONE-acetaminophen (ROXICET) 5-325 MG tablet Take 1 tablet by mouth every 6 (six) hours as needed for moderate pain or severe pain. Patient not taking: Reported on 03/23/2018 06/06/16   Menshew, Charlesetta Ivory, PA-C  traMADol (ULTRAM) 50 MG tablet Take 1 tablet (50 mg total) by mouth every 6 (six) hours as needed. Patient not taking: Reported on 03/23/2018 10/27/14   Eber Hong, MD    Family History History reviewed. No pertinent family history.  Social History Social History   Tobacco Use  . Smoking status: Current Every Day Smoker  . Smokeless tobacco: Never Used  Substance Use Topics  . Alcohol use: Yes  . Drug use: Yes    Types: Marijuana     Allergies   Codeine; Hydrocodone; Iohexol; and Morphine and related   Review of Systems Review of Systems  Constitutional: Negative for chills and fever.  Eyes: Negative.  Negative for visual disturbance.  Respiratory: Negative.  Negative for shortness of breath.   Cardiovascular: Negative.  Negative for chest pain.  Gastrointestinal: Negative.  Negative for abdominal pain.  Neurological: Negative.  Negative for dizziness, syncope, weakness, light-headedness, numbness and headaches.  Psychiatric/Behavioral: Negative for confusion, hallucinations, self-injury and suicidal ideas. The patient is nervous/anxious.   All other systems reviewed and are negative.  Physical Exam Updated Vital Signs BP 115/80 (BP Location: Left Arm)   Pulse 79   Temp 97.6 F (36.4 C) (Oral)   Resp 20   Ht 5\' 4"  (1.626 m)   Wt 72.6 kg   SpO2 100%   BMI 27.47 kg/m   Physical Exam  Constitutional: She is oriented to person, place, and time. She appears well-developed and well-nourished. She is cooperative. No distress.  HENT:  Head: Normocephalic and atraumatic.  Right Ear: Hearing, tympanic membrane, external ear and ear canal normal. No hemotympanum.  Left Ear: Hearing, tympanic membrane, external ear and ear canal normal. No hemotympanum.   Nose: Nose normal.  Eyes: Pupils are equal, round, and reactive to light. EOM are normal.  Neck: Trachea normal and normal range of motion. No tracheal deviation present.  Cardiovascular: Normal rate, regular rhythm, normal heart sounds and intact distal pulses.  Pulmonary/Chest: Effort normal and breath sounds normal. No respiratory distress.  Abdominal: Soft. Bowel sounds are normal. There is no tenderness. There is no rebound and no guarding.  Musculoskeletal: Normal range of motion.  Neurological: She is alert and oriented to person, place, and time. She has normal strength. No cranial nerve deficit or sensory deficit. She displays a negative Romberg sign.  Mental Status: Alert, oriented, thought content appropriate, able to give a coherent history. Speech fluent without evidence of aphasia. Able to follow 2 step commands without difficulty. Cranial Nerves: II: Peripheral visual fields grossly normal, pupils equal, round, reactive to light III,IV, VI: ptosis not present, extra-ocular motions  intact bilaterally V,VII: smile symmetric, eyebrows raise symmetric, facial light touch sensation equal VIII: hearing grossly normal to voice X: uvula elevates symmetrically XI: bilateral shoulder shrug symmetric and strong XII: midline tongue extension without fassiculations Motor: Normal tone. 5/5 strength in upper and lower extremities bilaterally including strong and equal grip strength and dorsiflexion/plantar flexion Sensory: Sensation intact to light touch in all extremities.Negative Romberg.  Cerebellar: normal finger-to-nose with bilateral upper extremities. Normal heel-to -shin balance bilaterally of the lower extremity. No pronator drift.  Gait: normal gait and balance CV: distal pulses palpable throughout  Skin: Skin is warm and dry. Capillary refill takes less than 2 seconds.  Psychiatric: She has a normal mood and affect. Her speech is normal and behavior is normal. Cognition  and memory are normal. She is attentive.    ED Treatments / Results  Labs (all labs ordered are listed, but only abnormal results are displayed) Labs Reviewed  COMPREHENSIVE METABOLIC PANEL - Abnormal; Notable for the following components:      Result Value   Glucose, Bld 110 (*)    All other components within normal limits  ETHANOL - Abnormal; Notable for the following components:   Alcohol, Ethyl (B) 137 (*)    All other components within normal limits  ACETAMINOPHEN LEVEL - Abnormal; Notable for the following components:   Acetaminophen (Tylenol), Serum <10 (*)    All other components within normal limits  CBC - Abnormal; Notable for the following components:   Hemoglobin 15.3 (*)    All other components within normal limits  CBG MONITORING, ED - Abnormal; Notable for the following components:   Glucose-Capillary 103 (*)    All other components within normal limits  SALICYLATE LEVEL  RAPID URINE DRUG SCREEN, HOSP PERFORMED  I-STAT BETA HCG BLOOD, ED ([facility], [facility], [facility] ONLY)    EKG EKG Interpretation  Date/Time:  Monday March 23 2018 05:43:56 EDT Ventricular Rate:  80 PR Interval:    QRS Duration: 95 QT Interval:  397 QTC Calculation: 458 R Axis:   32 Text Interpretation:  Sinus rhythm Low voltage, precordial leads Probable anteroseptal infarct, old Since last tracing rate slower 25 Nov 2017 Confirmed by Devoria Albe ([id number]) on 03/23/2018 5:57:06 AM Also confirmed by Devoria Albe ([id number]), editor Elita Quick ([id number])  on 03/23/2018 7:14:34 AM   Radiology No results found.  Procedures Procedures (including critical care time)  Medications Ordered in ED Medications - No data to display   Initial Impression / Assessment and Plan / ED Course  I have reviewed the triage vital signs and the nursing notes.  Pertinent labs & imaging results that were available during my care of the patient were reviewed by me and considered in my medical decision making (see chart for  details).    Patient presenting after valerian root overdose with alcohol intoxication last night.  Patient denying suicidal/homicidal ideations or intention to self-harm.  Patient feeling well, alert and oriented x4.  Patient without neurological deficits and no sign of injury today.  Beta hCG negative Ethanol 137 CBG 103 Salicylate negative CBC nonacute Acetaminophen level negative CMP nonacute Vital signs within normal limits  Patient also seen and evaluated by Dr. Patria Mane who agrees with discharge at this time.  At this time there does not appear to be any evidence of an acute emergency medical condition and the patient appears stable for discharge with appropriate outpatient follow up. Diagnosis was discussed with patient who verbalizes understanding of care plan and is agreeable to discharge. I  have discussed return precautions with patient  who verbalize understanding of return precautions. Patient strongly encouraged to follow-up with their PCP. All questions answered.  Patient's case discussed with and patient seen by Dr. Patria Mane who agrees with plan to discharge with follow-up.     Note: Portions of this report may have been transcribed using voice recognition software. Every effort was made to ensure accuracy; however, inadvertent computerized transcription errors may still be present. Final Clinical Impressions(s) / ED Diagnoses   Final diagnoses:  Accidental drug overdose, initial encounter  Alcoholic intoxication without complication [facility])    ED Discharge Orders    None       Elizabeth Palau 03/23/18 1610    Azalia Bilis, MD 03/23/18 [phone]

## 2018-03-23 NOTE — ED Notes (Signed)
Bed: RESB Expected date:  Expected time:  Means of arrival:  Comments: 42 yo F/OD

## 2018-03-23 NOTE — Discharge Instructions (Addendum)
Please return to the Emergency Department for any new or worsening symptoms or if your symptoms do not improve. Please be sure to follow up with your Primary Care Physician as soon as possible regarding your visit today. If you do not have a Primary Doctor please use the resources below to establish one. Please avoid using alcohol with other drugs.   Contact a health care provider if: Your symptoms return. You develop new symptoms or side effects when you take medicines. Get help right away if: You think that you or someone else may have taken too much of a drug. The hotline of the [facility] is 859-340-4264. You or someone else is having symptoms of a drug overdose. You have serious thoughts about hurting yourself or others. You become confused. You have: Chest pain. Difficulty breathing. A loss of consciousness.  RESOURCE GUIDE  Chronic Pain Problems: Contact [facility]  [phone] Patients need to be referred by their primary care doctor.  Insufficient Money for Medicine: Contact United Way:  call "211" or [facility] [phone].  No Primary Care Doctor: Call Health Connect  [phone] - can help you locate a primary care doctor that  accepts your insurance, provides certain services, etc. Physician Referral Service- [phone]  Agencies that provide inexpensive medical care: [facility]  [phone] [facility]  [phone] [facility]  [phone] [facility]  [phone] [facility]  [phone] [facility]  [phone]  Medicaid-accepting [facility] Providers: [facility]- [address], [address]  [phone], Mon-Fri 9am-7pm, Sat 9am-1pm [facility]- [address] [place], [address] [place]  [phone] [facility]- [address], [address] [place]  [phone] [facility]- [address]  [phone] Renaye Rakers- [address] [place], [address], [phone]  Only accepts [place] patients after they have their name  applied to their card  Self Pay (no insurance) in [facility]: Sickle Cell Patients: Dr Willey Blade, [facility]  [address] [place], [phone] [facility]- [address] [place]  [phone]       [facility] [place]- [address]       -     [facility]- see information above (Speak to [employer] if you do not have insurance)       -  [facility]- [address] [place], [phone]       -  [facility]- [address],  [phone]       -  [facility]- [address] [phone]       -  Dr Julio Sicks-  [address], [place], [phone]       -  [facility]- [address] [phone]       -  [facility]- [address], [phone], also [address] [phone]       -    [facility]- [address] [place], [phone], 1st & 3rd Saturday   every month, 10am-1pm  1) Find a Doctor and Pay Out of Pocket Although you won't have to find out who is covered by your insurance plan, it is a good idea to ask around and get recommendations. You will then need to call the  office and see if the doctor you have chosen will accept you as a new patient and what types of options they offer for patients who are self-pay. Some doctors offer discounts or will set up payment plans for their patients who do not have insurance, but you will need to ask so you aren't surprised when you get to your appointment.  2) Contact Your Local Health Department Not all health departments have doctors that can see patients for sick visits, but many do, so it is worth a call to see if yours does. If you don't know where your local health department is, you can check in your phone book. The CDC also has a tool to help you locate your state's health  department, and many state websites also have listings of all of their local health departments.  3) Find a Walk-in Clinic If your illness is not likely to be very severe or complicated, you may want to try a walk in clinic. These are popping up all over the country in pharmacies, drugstores, and shopping centers. They're usually staffed by nurse practitioners or physician assistants that have been trained to treat common illnesses and complaints. They're usually fairly quick and inexpensive. However, if you have serious medical issues or chronic medical problems, these are probably not your best option  STD Testing [facility] Department of [facility] [place], STD Clinic, [address] [place], phone [phone] or [phone].  Monday - Friday, call for an appointment. [facility] Department of [employer], STD Clinic, [address], [place], phone [phone] or [phone].  Monday - Friday, call for an appointment.  Abuse/Neglect: [facility] Child Abuse Hotline 212-267-4935 [facility] Child Abuse Hotline (401)631-1222 (After Hours)  Emergency Shelter:  [facility] (469) 417-4802  Maternity Homes: [facility] [place] [facility] [phone] [facility] [phone]  MRSA Hotline #:   570-407-5057  [facility] Resources  [facility]  [employer] [facility] Dept. [address]                 [address]         [address] [place] [address]  Blondell Reveal Phone:  [phone]                                  Phone:  [phone]                   Phone:  [phone]  [facility], [phone] [facility] [facility] [phone]       -     [facility] in [place], [address]                                   [phone], Insurance  [place] Child Abuse  Hotline 605-723-0298 or 858-119-3756 (After Hours)   Behavioral Health Services  Substance Abuse Resources: [facility]  [phone] [facility] 620-559-1737 [facility] [phone] [facility] [phone] Residential & Outpatient Substance Abuse Program  619-052-4131  Psychological Services: [facility] Health  ([phone])[phone] [facility] Services  [phone] [facility], [phone] [place]. [address], [place], ACCESS LINE: 435-836-9941 or 450-742-7592, [employer]  Dental Assistance  If unable to pay or uninsured, contact:  [facility] or [facility]. to become qualified for the adult dental clinic.  Patients with Medicaid: [facility] [phone] [address], [phone] [address] [phone]  If unable to pay, or uninsured, contact [facility] (343) [phone]) or [facility] Department [phone] in [place], [phone] in [facility] to become qualified for the adult dental clinic   Other Low-Cost Community Dental Services: [facility]- [address] [place], [place], [place], [address], [phone], [address], 2nd and 4th Thursday of the month at 6:30am.  10 clients each day by appointment, can sometimes see walk-in patients if someone does not show for an appointment. [facility]- [address] [place], [place], [address], [phone] [facility] [address] [place], [place], [address], [phone] [facility] Health Department- [phone] [facility] Health Department- [phone] [facility] [phone]

## 2018-03-23 NOTE — ED Notes (Signed)
Pt moved to Rm 15 and connected to monitor cycling q 30 min.

## 2018-03-23 NOTE — ED Provider Notes (Signed)
MSE was initiated and I personally evaluated the patient and placed orders (if any) at  5:20 AM on March 23, 2018.  The patient appears stable so that the remainder of the MSE may be completed by another provider.  Patient presents via EMS ambulatory to her room and is cooperative.  She evidently took some valerian root and alcohol tonight.   Devoria Albe, MD 03/23/18 [phone]

## 2018-03-23 NOTE — ED Notes (Signed)
ED Provider at bedside. 

## 2018-03-23 NOTE — ED Triage Notes (Signed)
Pt arrived by EMS from home. Pt reports taking unknown amount of Valerian root along with drinking a bottle of wine. Pt alert and oriented on arrival. Vitals stable at this time. Pt reports that she took medication just to sleep. Pt reports that she has been under a lot of stress.

## 2020-11-30 ENCOUNTER — Encounter (HOSPITAL_COMMUNITY): Payer: Self-pay

## 2020-11-30 ENCOUNTER — Emergency Department (HOSPITAL_COMMUNITY)
Admission: EM | Admit: 2020-11-30 | Discharge: 2020-11-30 | Disposition: A | Discharge status: Home / Self Care | Payer: Self-pay | Attending: Emergency Medicine | Admitting: Emergency Medicine

## 2020-11-30 ENCOUNTER — Emergency Department (HOSPITAL_COMMUNITY): Payer: Self-pay

## 2020-11-30 ENCOUNTER — Other Ambulatory Visit: Payer: Self-pay

## 2020-11-30 DIAGNOSIS — R112 Nausea with vomiting, unspecified: Secondary | ICD-10-CM | POA: Insufficient documentation

## 2020-11-30 DIAGNOSIS — R1011 Right upper quadrant pain: Secondary | ICD-10-CM | POA: Insufficient documentation

## 2020-11-30 DIAGNOSIS — R630 Anorexia: Secondary | ICD-10-CM | POA: Insufficient documentation

## 2020-11-30 DIAGNOSIS — R1013 Epigastric pain: Secondary | ICD-10-CM | POA: Insufficient documentation

## 2020-11-30 DIAGNOSIS — Z7982 Long term (current) use of aspirin: Secondary | ICD-10-CM | POA: Insufficient documentation

## 2020-11-30 DIAGNOSIS — F17210 Nicotine dependence, cigarettes, uncomplicated: Secondary | ICD-10-CM | POA: Insufficient documentation

## 2020-11-30 LAB — I-STAT BETA HCG BLOOD, ED (MC, WL, AP ONLY): I-stat hCG, quantitative: <5 m[IU]/mL (ref <5)

## 2020-11-30 LAB — COMPREHENSIVE METABOLIC PANEL
ALT: 17 U/L (ref 0–44)
AST: 20 U/L (ref 15–41)
Albumin: 4.3 g/dL (ref 3.5–5.0)
Alkaline Phosphatase: 84 U/L (ref 38–126)
Anion gap: 9 (ref 5–15)
BUN: 8 mg/dL (ref 6–20)
CO2: 22 mmol/L (ref 22–32)
Calcium: 9.2 mg/dL (ref 8.9–10.3)
Chloride: 106 mmol/L (ref 98–111)
Creatinine, Ser: 0.55 mg/dL (ref 0.44–1.00)
GFR, Estimated: >60 mL/min (ref >60)
Glucose, Bld: 113 mg/dL — ABNORMAL HIGH (ref 70–99)
Potassium: 3.8 mmol/L (ref 3.5–5.1)
Sodium: 137 mmol/L (ref 135–145)
Total Bilirubin: 0.6 mg/dL (ref 0.3–1.2)
Total Protein: 7.7 g/dL (ref 6.5–8.1)

## 2020-11-30 LAB — CBC
HCT: 42.7 % (ref 36.0–46.0)
Hemoglobin: 14.8 g/dL (ref 12.0–15.0)
MCH: 33 pg (ref 26.0–34.0)
MCHC: 34.7 g/dL (ref 30.0–36.0)
MCV: 95.1 fL (ref 80.0–100.0)
Platelets: 213 10*3/uL (ref 150–400)
RBC: 4.49 MIL/uL (ref 3.87–5.11)
RDW: 12.6 % (ref 11.5–15.5)
WBC: 5.6 10*3/uL (ref 4.0–10.5)
nRBC: 0 % (ref 0.0–0.2)

## 2020-11-30 LAB — URINALYSIS, ROUTINE W REFLEX MICROSCOPIC
Bilirubin Urine: NEGATIVE
Glucose, UA: NEGATIVE mg/dL
Hgb urine dipstick: NEGATIVE
Ketones, ur: 5 mg/dL — AB
Leukocytes,Ua: NEGATIVE
Nitrite: NEGATIVE
Protein, ur: NEGATIVE mg/dL
Specific Gravity, Urine: 1.026 (ref 1.005–1.030)
pH: 5 (ref 5.0–8.0)

## 2020-11-30 LAB — LIPASE, BLOOD: Lipase: 28 U/L (ref 11–51)

## 2020-11-30 MED ORDER — ONDANSETRON HCL 4 MG PO TABS: 4.0000 mg | ORAL_TABLET | Freq: Three times a day (TID) | ORAL | 0 refills | Status: AC | PRN

## 2020-11-30 MED ORDER — NAPROXEN 375 MG PO TABS: 375.0000 mg | ORAL_TABLET | Freq: Two times a day (BID) | ORAL | 0 refills | Status: AC

## 2020-11-30 MED ORDER — KETOROLAC TROMETHAMINE 30 MG/ML IJ SOLN
30.0000 mg | Freq: Once | INTRAMUSCULAR | Status: AC
  Administered 2020-11-30: 30 mg via INTRAMUSCULAR
  Filled 2020-11-30: qty 1

## 2020-11-30 MED ORDER — ONDANSETRON 4 MG PO TBDP
4.0000 mg | ORAL_TABLET | Freq: Once | ORAL | Status: AC
  Administered 2020-11-30: 4 mg via ORAL
  Filled 2020-11-30: qty 1

## 2020-11-30 NOTE — ED Notes (Signed)
Pt given rx for med, med sent to pharmacy, given work note, advised to follow up w PCP and GI, pt did not have questions, ready for discharge. Pt walks independently, steady gait

## 2020-11-30 NOTE — ED Triage Notes (Signed)
Patient c/o RUQ pain x 1 month. Patient states she has been vomiting even when she drinks liquids. Patient states she began having a fever 2 days ago. Patient has not taken her temperature at home or work.

## 2020-11-30 NOTE — ED Provider Notes (Signed)
Emergency Medicine Provider Triage Evaluation Note  Cheryl Mcconnell , a 45 y.o. female  was evaluated in triage.  Pt complains of RUQ pain x 1 month. Worse when eating. NBNB emesis. Diarrhea without melena. Prior appy. Subjective fever. Pain occasionally radiates into back.   Last PO intake at 0830  Review of Systems  Positive: abd pain, diarrhea, emesis Negative: HA, cough  Physical Exam  BP (!) 153/117 (BP Location: Left Arm)   Pulse 91   Temp 98.3 F (36.8 C) (Oral)   Resp 16   Ht 5\' 4"  (1.626 m)   Wt 77.1 kg   LMP 11/23/2020 (Approximate)   SpO2 98%   BMI 29.18 kg/m  Gen:   Awake, no distress   Resp:  Normal effort  ABD:  RUQ tenderness MSK:   Moves extremities without difficulty  Other:    Medical Decision Making  Medically screening exam initiated at 12:11 PM.  Appropriate orders placed.  [facility] was informed that the remainder of the evaluation will be completed by another provider, this initial triage assessment does not replace that evaluation, and the importance of remaining in the ED until their evaluation is complete.  RUQ pain worse with eating.  Getting labs, imaging     Saphia Vanderford A, PA-C 11/30/20 1214    12/02/20, MD 11/30/20 1751

## 2020-11-30 NOTE — ED Provider Notes (Signed)
[facility]-EMERGENCY DEPT Provider Note   CSN: [id number] Arrival date & time: 11/30/20  1143     History Chief Complaint  Patient presents with  . Abdominal Pain  . Fever  . Emesis    Cheryl Mcconnell is a 45 y.o. female.  HPI   45 year old female past medical history of appendectomy presents to the emergency department concern for right upper quadrant abdominal pain associated with fever/nausea/vomiting/diarrhea.  Patient states the discomfort started about 2 weeks ago, has become persistent, worse with eating.  She has had a significant decrease in her appetite and diet.  States that she has nonbloody emesis and stools.  Subjective fevers without an actual temperature.  No history of gallstones or problems with her gallbladder before.  Denies any pain radiation to her epigastric/chest/upper abdomen.  Denies any genitourinary symptoms.  Past Medical History:  Diagnosis Date  . MRSA (methicillin resistant staph aureus) culture positive     There are no problems to display for this patient.   Past Surgical History:  Procedure Laterality Date  . APPENDECTOMY    . CESAREAN SECTION       OB History   No obstetric history on file.     Family History  Problem Relation Age of Onset  . Heart attack Mother   . Cancer Father   . Hepatitis C Father     Social History   Tobacco Use  . Smoking status: Current Every Day Smoker    Packs/day: 0.50    Types: Cigarettes  . Smokeless tobacco: Never Used  Vaping Use  . Vaping Use: Never used  Substance Use Topics  . Alcohol use: Yes  . Drug use: Not Currently    Types: Marijuana    Home Medications Prior to Admission medications   Medication Sig Start Date End Date Taking? Authorizing Provider  Aspirin-Acetaminophen-Caffeine (PAMPRIN MAX PO) Take 1 tablet by mouth 2 (two) times daily as needed (cramps). For cramps    [provider]  calcium carbonate (TUMS - DOSED IN MG ELEMENTAL  CALCIUM) 500 MG chewable tablet Chew 1 tablet by mouth 3 (three) times daily as needed for indigestion or heartburn.    [provider]  Doxylamine Succinate, Sleep, (SLEEP AID PO) Take 4 tablets by mouth at bedtime as needed (sleep).    [provider]  EPINEPHrine (EPIPEN) 0.3 mg/0.3 mL SOAJ injection Inject 0.3 mL (0.3 mg total) into the muscle as needed. 02/24/13   Sherryl Manges, MD  FENUGREEK PO Take 1 tablet by mouth daily.    [provider]  fluticasone (FLONASE) 50 MCG/ACT nasal spray Place 1 spray into both nostrils daily as needed for allergies or rhinitis.    [provider]  Ibuprofen 200 MG CAPS Take 800 mg by mouth every 8 (eight) hours as needed (pain).     [provider]  naproxen (NAPROSYN) 500 MG tablet Take 1 tablet (500 mg total) by mouth 2 (two) times daily with a meal. Patient not taking: Reported on 03/23/2018 10/27/14   Eber Hong, MD  ondansetron (ZOFRAN ODT) 4 MG disintegrating tablet Take 1 tablet (4 mg total) by mouth every 8 (eight) hours as needed for nausea. Patient not taking: Reported on 03/23/2018 10/27/14   Eber Hong, MD  ondansetron (ZOFRAN ODT) 4 MG disintegrating tablet Take 1 tablet (4 mg total) by mouth every 6 (six) hours as needed for nausea or vomiting. Patient not taking: Reported on 03/23/2018 06/06/16   Menshew, Charlesetta Ivory,  PA-C  oxyCODONE-acetaminophen (ROXICET) 5-325 MG tablet Take 1 tablet by mouth every 6 (six) hours as needed for moderate pain or severe pain. Patient not taking: Reported on 03/23/2018 06/06/16   Menshew, Charlesetta Ivory, PA-C  traMADol (ULTRAM) 50 MG tablet Take 1 tablet (50 mg total) by mouth every 6 (six) hours as needed. Patient not taking: Reported on 03/23/2018 10/27/14   Eber Hong, MD  VALERIAN ROOT PO Take 2 capsules by mouth daily.    [provider]  vitamin C (ASCORBIC ACID) 500 MG tablet Take 500 mg by mouth daily.    [provider]  Zinc 100 MG TABS Take  100 mg by mouth daily.     [provider]    Allergies    Codeine, Hydrocodone, Iohexol, and Morphine and related  Review of Systems   Review of Systems  Constitutional: Positive for appetite change, fatigue and fever. Negative for chills.  HENT: Negative for congestion.   Eyes: Negative for visual disturbance.  Respiratory: Negative for shortness of breath.   Cardiovascular: Negative for chest pain.  Gastrointestinal: Positive for abdominal pain, diarrhea, nausea and vomiting.  Genitourinary: Negative for dysuria.  Skin: Negative for rash.  Neurological: Negative for headaches.    Physical Exam Updated Vital Signs BP 136/84 (BP Location: Left Arm)   Pulse 82   Temp 98.3 F (36.8 C) (Oral)   Resp 18   Ht 5\' 4"  (1.626 m)   Wt 77.1 kg   LMP 11/23/2020 (Approximate)   SpO2 96%   BMI 29.18 kg/m   Physical Exam Vitals and nursing note reviewed.  Constitutional:      Appearance: Normal appearance.  HENT:     Head: Normocephalic.     Mouth/Throat:     Mouth: Mucous membranes are moist.  Cardiovascular:     Rate and Rhythm: Normal rate.  Pulmonary:     Effort: Pulmonary effort is normal. No respiratory distress.  Abdominal:     Palpations: Abdomen is soft.     Tenderness: There is abdominal tenderness in the right upper quadrant and epigastric area. There is no guarding or rebound.  Skin:    General: Skin is warm.  Neurological:     Mental Status: She is alert and oriented to person, place, and time. Mental status is at baseline.  Psychiatric:        Mood and Affect: Mood normal.     ED Results / Procedures / Treatments   Labs (all labs ordered are listed, but only abnormal results are displayed) Labs Reviewed  COMPREHENSIVE METABOLIC PANEL - Abnormal; Notable for the following components:      Result Value   Glucose, Bld 113 (*)    All other components within normal limits  LIPASE, BLOOD  CBC  URINALYSIS, ROUTINE W REFLEX MICROSCOPIC  I-STAT BETA  HCG BLOOD, ED ([facility], [facility], [facility] ONLY)    EKG None  Radiology 01/23/2021 Abdomen Limited RUQ (LIVER/GB)  Result Date: 11/30/2020 CLINICAL DATA:  Upper abdominal pain EXAM: ULTRASOUND ABDOMEN LIMITED RIGHT UPPER QUADRANT COMPARISON:  CT abdomen and pelvis October 27, 2014 FINDINGS: Gallbladder: No gallstones or wall thickening visualized. There is no pericholecystic fluid. No sonographic Murphy sign noted by sonographer. Common bile duct: Diameter: 3 mm. No intrahepatic or extrahepatic biliary duct dilatation. Liver: No focal lesion identified. Within normal limits in parenchymal echogenicity. Portal vein is patent on color Doppler imaging with normal direction of blood flow towards the liver. Other: None. IMPRESSION: Study within normal limits. Electronically  Signed   By: Bretta Bang III M.D.   On: 11/30/2020 13:15    Procedures Procedures   Medications Ordered in ED Medications  ketorolac (TORADOL) 30 MG/ML injection 30 mg (has no administration in time range)  ondansetron (ZOFRAN-ODT) disintegrating tablet 4 mg (has no administration in time range)    ED Course  I have reviewed the triage vital signs and the nursing notes.  Pertinent labs & imaging results that were available during my care of the patient were reviewed by me and considered in my medical decision making (see chart for details).    MDM Rules/Calculators/A&P                          45 year old female presents the emergency department concern for right upper quadrant pain associated with fever/nausea/vomiting/diarrhea.  She is tender on initial exam.  Vitals are stable here, afebrile.  Pregnancy test negative, blood work here is very reassuring with no acute abnormality.  Ultrasound of the gallbladder shows no acute finding.  Plan to treat symptomatically and p.o.  If she tolerates then plan for outpatient follow-up, education on possible biliary dyskinesia, HIDA scan and GI follow-up.  Strict return to ED precautions  discussed.  Final Clinical Impression(s) / ED Diagnoses Final diagnoses:  RUQ pain    Rx / DC Orders ED Discharge Orders    None       Rozelle Logan, DO 11/30/20 1612

## 2020-11-30 NOTE — Discharge Instructions (Signed)
You have been seen and discharged from the emergency department.  Follow-up with your primary provider and gastroenterology for reevaluation and further care.  The gallbladder may be dysfunctioning but today your blood work and CAT scan was normal.  Take nausea medicine as needed.  Take pain medicine as prescribed.  Take home medications as prescribed. If you have any worsening symptoms or further concerns for your health please return to an emergency department for further evaluation.

## 2020-11-30 NOTE — ED Notes (Signed)
Pt ate and drank, denies any s/s of n/v. Provider made aware

## 2023-03-14 IMAGING — US US ABDOMEN LIMITED
1 series · 14 of 25 positions shown · non-contrast
Comparison: CT abdomen and pelvis October 27, 2014

CLINICAL DATA: Upper abdominal pain

EXAM:
ULTRASOUND ABDOMEN LIMITED RIGHT UPPER QUADRANT

[Series 1: us abdomen limited · 14 of 46 slices shown]
[im 1/46]
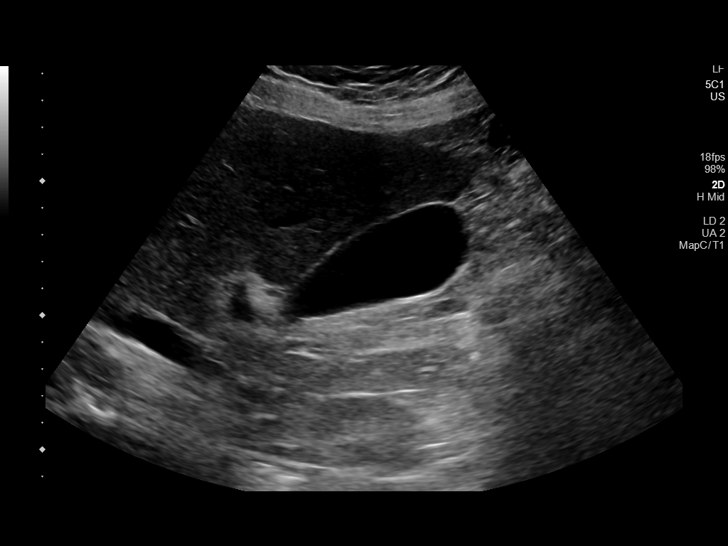
[im 4/46]
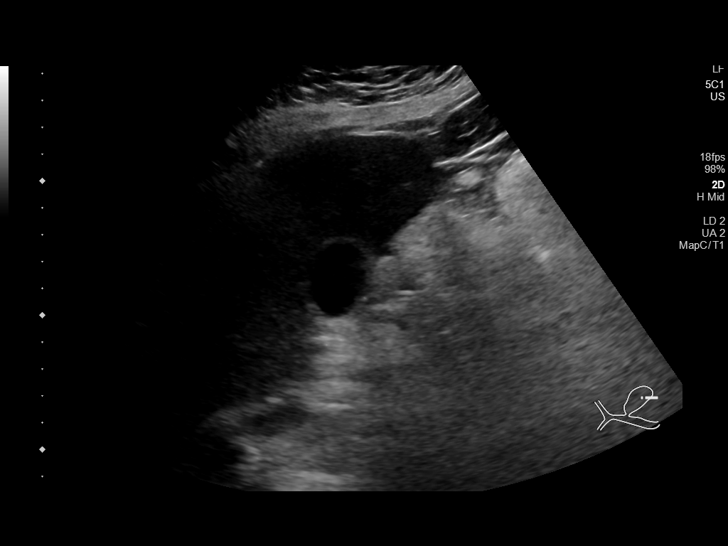
[im 8/46]
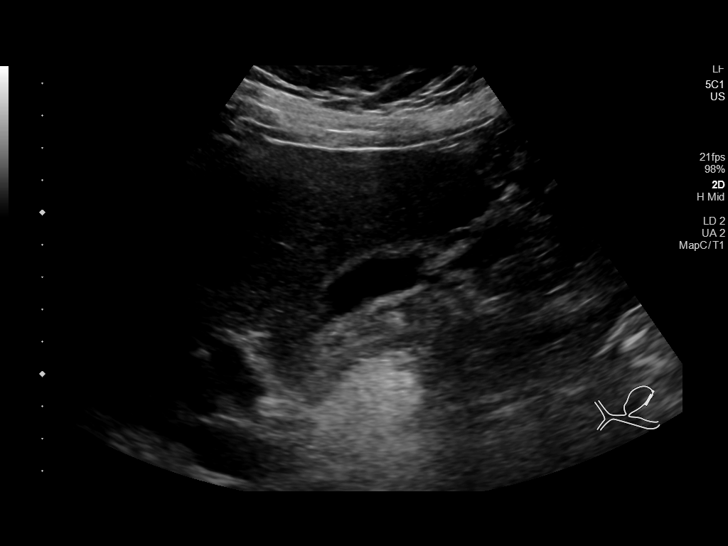
[im 12/46]
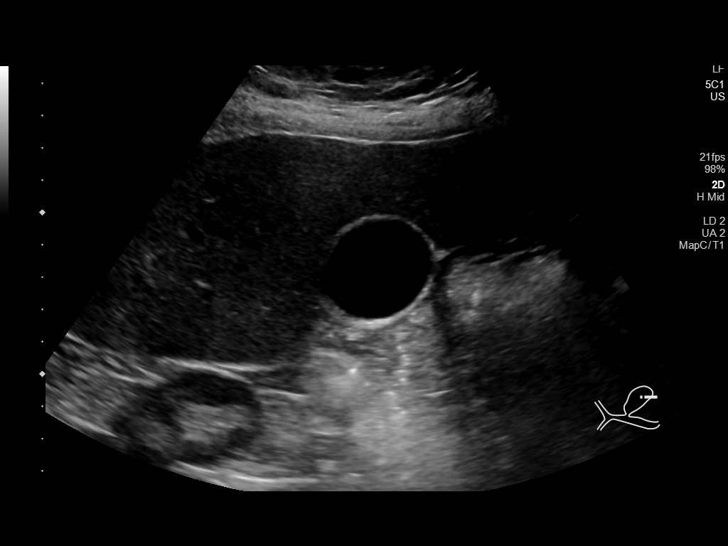
[im 16/46]
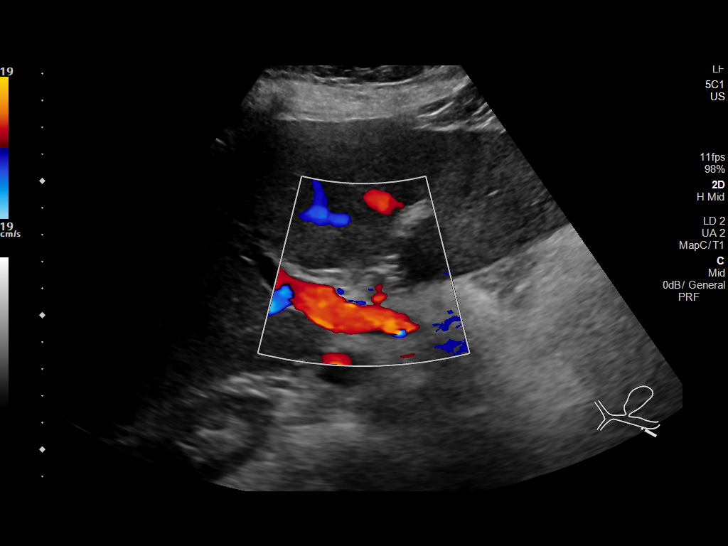
[im 17/46]
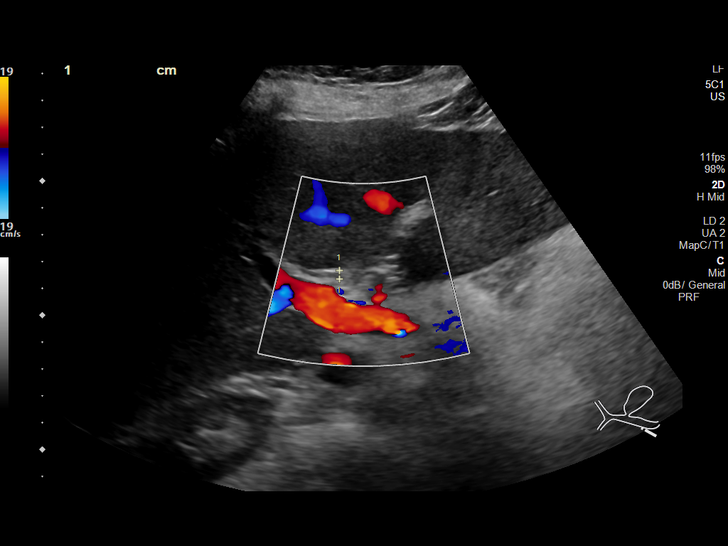
[im 21/46]
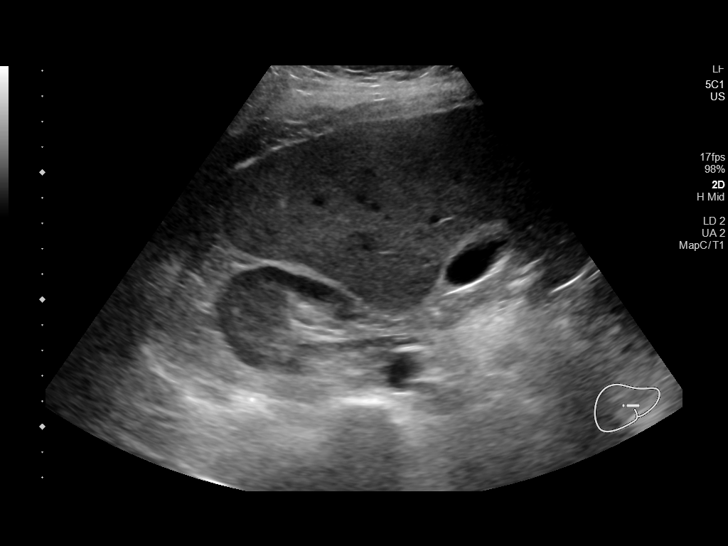
[im 25/46]
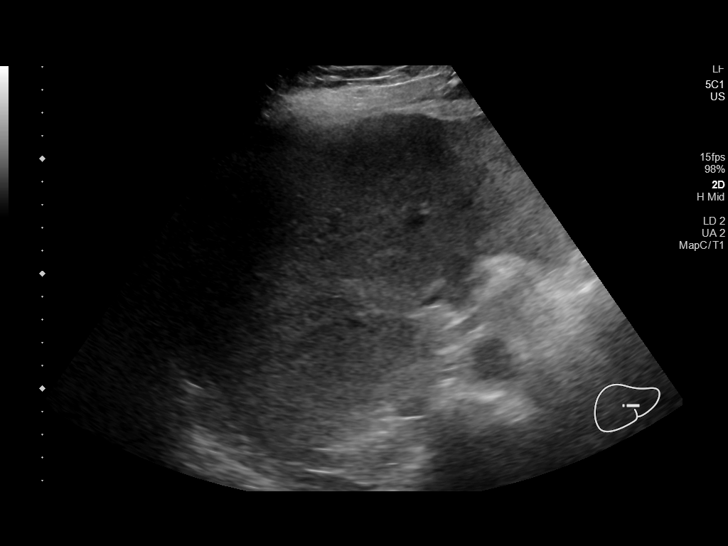
[im 29/46]
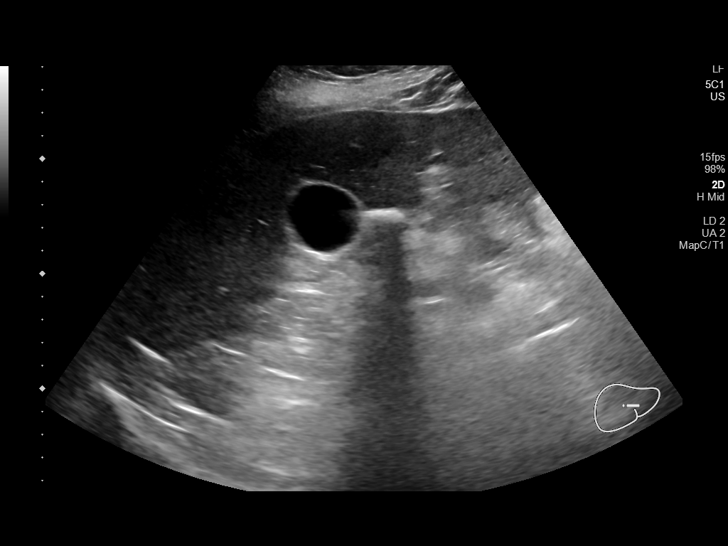
[im 31/46]
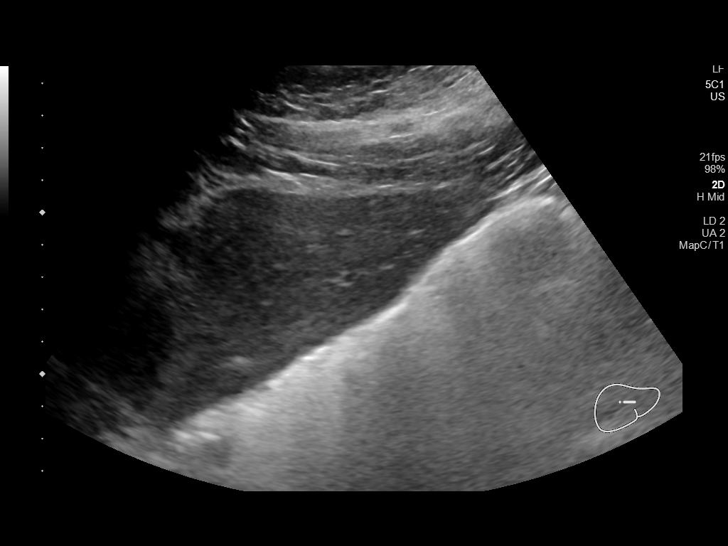
[im 34/46]
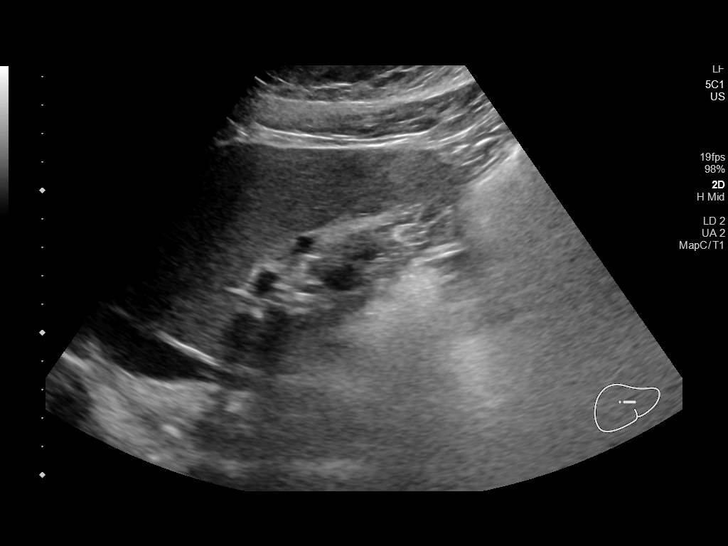
[im 38/46]
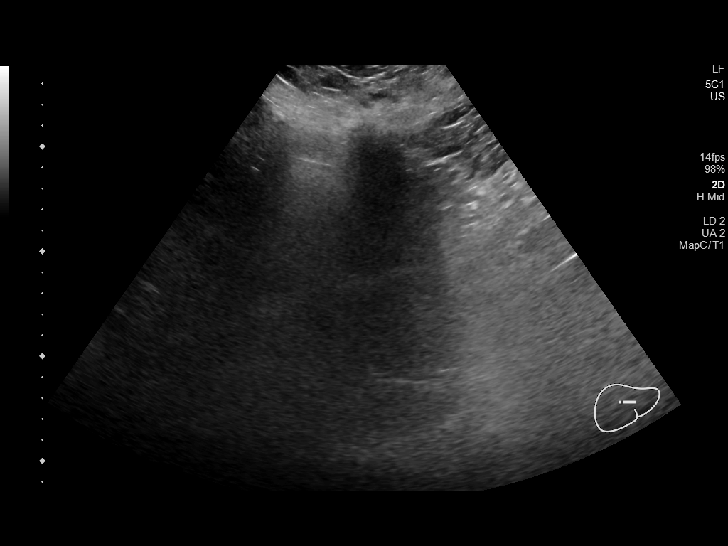
[im 42/46]
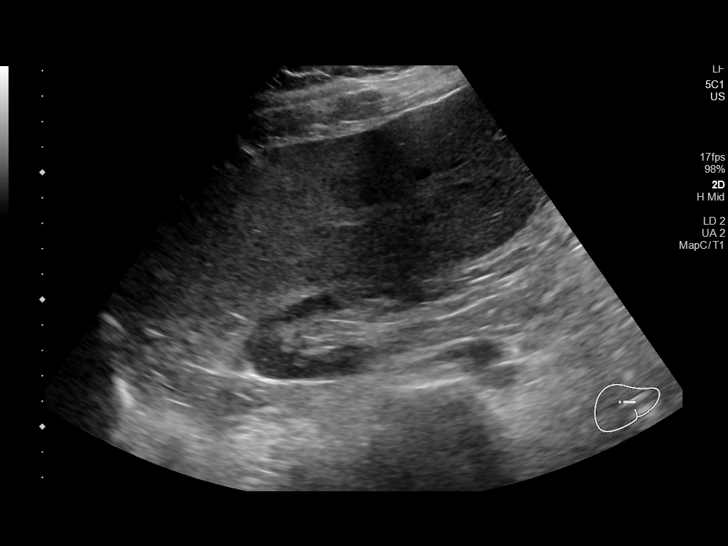
[im 46/46]
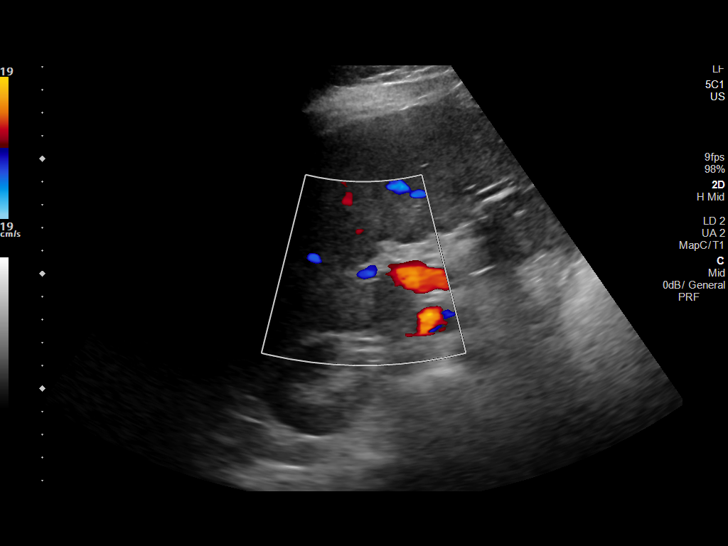

[14 of 25 positions shown; findings below may reference images not displayed]

FINDINGS: Gallbladder:

No gallstones or wall thickening visualized. There is no
pericholecystic fluid. No sonographic Murphy sign noted by
sonographer.

Common bile duct:

Diameter: 3 mm. No intrahepatic or extrahepatic biliary duct
dilatation.

Liver:

No focal lesion identified. Within normal limits in parenchymal
echogenicity. Portal vein is patent on color Doppler imaging with
normal direction of blood flow towards the liver.

Other: None.
IMPRESSION: Study within normal limits.

## 2023-06-03 ENCOUNTER — Emergency Department (HOSPITAL_COMMUNITY)
Admission: EM | Admit: 2023-06-03 | Discharge: 2023-06-03 | Discharge status: Against Medical Advice | Payer: 59 | Attending: Emergency Medicine | Admitting: Emergency Medicine

## 2023-06-03 DIAGNOSIS — Z5321 Procedure and treatment not carried out due to patient leaving prior to being seen by health care provider: Secondary | ICD-10-CM | POA: Diagnosis not present

## 2023-06-03 DIAGNOSIS — N939 Abnormal uterine and vaginal bleeding, unspecified: Secondary | ICD-10-CM | POA: Diagnosis not present

## 2023-06-03 LAB — CBC
HCT: 35.7 % — ABNORMAL LOW (ref 36.0–46.0)
Hemoglobin: 11.7 g/dL — ABNORMAL LOW (ref 12.0–15.0)
MCH: 29.3 pg (ref 26.0–34.0)
MCHC: 32.8 g/dL (ref 30.0–36.0)
MCV: 89.3 fL (ref 80.0–100.0)
Platelets: 248 10*3/uL (ref 150–400)
RBC: 4 MIL/uL (ref 3.87–5.11)
RDW: 13.5 % (ref 11.5–15.5)
WBC: 6.5 10*3/uL (ref 4.0–10.5)
nRBC: 0 % (ref 0.0–0.2)

## 2023-06-03 LAB — HCG, SERUM, QUALITATIVE: Preg, Serum: NEGATIVE

## 2023-06-03 NOTE — ED Triage Notes (Signed)
Pt to ED POV from home. Pt states she has had a 16 day long period and reports the bleeding has gotten heavier over the last few days. Pt states she states she has been going through a full pack of pads everyday and states there is usually about 14 pads in each pack. Pt reports increased cramping on the right side. Pt also reports feeling fatigued. Pt has hx of cysts and was told by her OB to get the depo injections, but states she has not had an injection since February and reports she broke out in hives after the injection.

## 2023-06-03 NOTE — ED Notes (Signed)
Pt called for vitals x3 with no answer

## 2024-05-06 ENCOUNTER — Emergency Department (HOSPITAL_COMMUNITY): Payer: MEDICAID

## 2024-05-06 ENCOUNTER — Other Ambulatory Visit: Payer: Self-pay

## 2024-05-06 ENCOUNTER — Emergency Department (HOSPITAL_COMMUNITY)
Admission: EM | Admit: 2024-05-06 | Discharge: 2024-05-06 | Disposition: A | Discharge status: Home / Self Care | Payer: MEDICAID | Attending: Emergency Medicine | Admitting: Emergency Medicine

## 2024-05-06 DIAGNOSIS — R350 Frequency of micturition: Secondary | ICD-10-CM | POA: Insufficient documentation

## 2024-05-06 DIAGNOSIS — R109 Unspecified abdominal pain: Secondary | ICD-10-CM | POA: Insufficient documentation

## 2024-05-06 DIAGNOSIS — N939 Abnormal uterine and vaginal bleeding, unspecified: Secondary | ICD-10-CM | POA: Insufficient documentation

## 2024-05-06 LAB — URINALYSIS, ROUTINE W REFLEX MICROSCOPIC
Bilirubin Urine: NEGATIVE
Bilirubin Urine: NEGATIVE
Glucose, UA: NEGATIVE mg/dL
Glucose, UA: NEGATIVE mg/dL
Hgb urine dipstick: NEGATIVE
Ketones, ur: NEGATIVE mg/dL
Ketones, ur: NEGATIVE mg/dL
Leukocytes,Ua: NEGATIVE
Leukocytes,Ua: NEGATIVE
Nitrite: NEGATIVE
Nitrite: NEGATIVE
Protein, ur: NEGATIVE mg/dL
Protein, ur: NEGATIVE mg/dL
Specific Gravity, Urine: 1.004 — ABNORMAL LOW (ref 1.005–1.030)
Specific Gravity, Urine: 1.003 — ABNORMAL LOW (ref 1.005–1.030)
pH: 8 (ref 5.0–8.0)
pH: 7 (ref 5.0–8.0)

## 2024-05-06 LAB — I-STAT CHEM 8, ED
BUN: <3 mg/dL — ABNORMAL LOW (ref 6–20)
Calcium, Ion: 1.17 mmol/L (ref 1.15–1.40)
Chloride: 105 mmol/L (ref 98–111)
Creatinine, Ser: 0.6 mg/dL (ref 0.44–1.00)
Glucose, Bld: 117 mg/dL — ABNORMAL HIGH (ref 70–99)
HCT: 44 % (ref 36.0–46.0)
Hemoglobin: 15 g/dL (ref 12.0–15.0)
Potassium: 4 mmol/L (ref 3.5–5.1)
Sodium: 139 mmol/L (ref 135–145)
TCO2: 23 mmol/L (ref 22–32)

## 2024-05-06 LAB — HEPATIC FUNCTION PANEL
ALT: 17 U/L (ref 0–44)
AST: 19 U/L (ref 15–41)
Albumin: 4.3 g/dL (ref 3.5–5.0)
Alkaline Phosphatase: 88 U/L (ref 38–126)
Bilirubin, Direct: <0.1 mg/dL (ref 0.0–0.2)
Total Bilirubin: 0.5 mg/dL (ref 0.0–1.2)
Total Protein: 7.9 g/dL (ref 6.5–8.1)

## 2024-05-06 LAB — CBC
HCT: 44.5 % (ref 36.0–46.0)
Hemoglobin: 15.2 g/dL — ABNORMAL HIGH (ref 12.0–15.0)
MCH: 30.6 pg (ref 26.0–34.0)
MCHC: 34.2 g/dL (ref 30.0–36.0)
MCV: 89.5 fL (ref 80.0–100.0)
Platelets: 293 K/uL (ref 150–400)
RBC: 4.97 MIL/uL (ref 3.87–5.11)
RDW: 13.2 % (ref 11.5–15.5)
WBC: 8.9 K/uL (ref 4.0–10.5)
nRBC: 0 % (ref 0.0–0.2)

## 2024-05-06 LAB — HCG, SERUM, QUALITATIVE: Preg, Serum: NEGATIVE

## 2024-05-06 NOTE — ED Triage Notes (Signed)
 Pt. Stated, Ive had vaginal bleeding for 3 weeks. I have pain on the right groin pain that radiates to the back. My vagina feels like its going to fall out. Im having a lot of pressure on my vagina

## 2024-05-06 NOTE — ED Notes (Addendum)
 Awaiting patient from lobby.

## 2024-05-06 NOTE — ED Provider Notes (Signed)
 [place] [facility] AT [facility] Provider Note   CSN: [id number] Arrival date & time: 05/06/24  1015     Patient presents with: Vaginal Bleeding, vaginal pressure, Groin Pain, Dysuria, and Polyuria   Cheryl Mcconnell is a 48 y.o. female.   HPI 48 year old female presents today complaining of vaginal bleeding.  She states she has been on her menstrual cycle for approximately 3 weeks.  She states she normally has somewhat heavy cycles but has never had ongoing bleeding like this.  She also feels like she is feeling something between her legs.  She reports frequency of urination but no pain with urination    Prior to Admission medications   Medication Sig Start Date End Date Taking? Authorizing Provider  Aspirin-Acetaminophen -Caffeine (PAMPRIN MAX PO) Take 1 tablet by mouth 2 (two) times daily as needed (cramps). For cramps    [provider]  calcium carbonate (TUMS - DOSED IN MG ELEMENTAL CALCIUM) 500 MG chewable tablet Chew 1 tablet by mouth 3 (three) times daily as needed for indigestion or heartburn.    [provider]  Doxylamine Succinate, Sleep, (SLEEP AID PO) Take 4 tablets by mouth at bedtime as needed (sleep).    [provider]  EPINEPHrine  (EPIPEN ) 0.3 mg/0.3 mL SOAJ injection Inject 0.3 mL (0.3 mg total) into the muscle as needed. 02/24/13   Lucio Savant, MD  FENUGREEK PO Take 1 tablet by mouth daily.    [provider]  fluticasone (FLONASE) 50 MCG/ACT nasal spray Place 1 spray into both nostrils daily as needed for allergies or rhinitis.    [provider]  Ibuprofen 200 MG CAPS Take 800 mg by mouth every 8 (eight) hours as needed (pain).     [provider]  ondansetron  (ZOFRAN ) 4 MG tablet Take 1 tablet (4 mg total) by mouth every 8 (eight) hours as needed for nausea or vomiting. 11/30/20   Horton, Roxie HERO, DO  oxyCODONE -acetaminophen  (ROXICET) 5-325 MG tablet Take 1 tablet by mouth every  6 (six) hours as needed for moderate pain or severe pain. Patient not taking: Reported on 03/23/2018 06/06/16   Menshew, Candida LULLA Kings, PA-C  traMADol  (ULTRAM ) 50 MG tablet Take 1 tablet (50 mg total) by mouth every 6 (six) hours as needed. Patient not taking: Reported on 03/23/2018 10/27/14   Cleotilde Rogue, MD  VALERIAN ROOT PO Take 2 capsules by mouth daily.    [provider]  vitamin C (ASCORBIC ACID) 500 MG tablet Take 500 mg by mouth daily.    [provider]  Zinc 100 MG TABS Take 100 mg by mouth daily.     [provider]    Allergies: Codeine, Depo-estradiol [estradiol], Hydrocodone, Iohexol, and Morphine  and codeine    Review of Systems  Updated Vital Signs BP (!) 107/90 (BP Location: Right Arm)   Pulse 96   Temp 98 F (36.7 C) (Oral)   Resp 16   Ht 1.626 m (5' 4)   Wt 90.7 kg   LMP 04/15/2024   SpO2 99%   BMI 34.33 kg/m   Physical Exam Vitals reviewed.  Constitutional:      Appearance: Normal appearance.  HENT:     Head: Normocephalic.     Right Ear: External ear normal.     Left Ear: External ear normal.     Nose: Nose normal.     Mouth/Throat:     Pharynx: Oropharynx is clear.  Eyes:     Pupils: Pupils are  equal, round, and reactive to light.  Cardiovascular:     Rate and Rhythm: Normal rate and regular rhythm.     Pulses: Normal pulses.  Pulmonary:     Effort: Pulmonary effort is normal.  Abdominal:     General: Abdomen is flat.     Palpations: Abdomen is soft.  Genitourinary:    General: Normal vulva.     Vagina: No vaginal discharge.     Comments: Patient with some dark blood in vaginal vault consistent with menstrual bleeding Cervix appears normal on my visual exam  And manual exam performed with some right sided tenderness Musculoskeletal:        General: Normal range of motion.     Cervical back: Normal range of motion.  Skin:    General: Skin is warm and dry.     Capillary Refill: Capillary refill takes less than 2  seconds.  Neurological:     General: No focal deficit present.     Mental Status: She is alert.  Psychiatric:        Mood and Affect: Mood normal.     (all labs ordered are listed, but only abnormal results are displayed) Labs Reviewed  URINALYSIS, ROUTINE W REFLEX MICROSCOPIC - Abnormal; Notable for the following components:      Result Value   Color, Urine AMBER (*)    APPearance CLOUDY (*)    Specific Gravity, Urine 1.004 (*)    Hgb urine dipstick LARGE (*)    Bacteria, UA MANY (*)    All other components within normal limits  CBC - Abnormal; Notable for the following components:   Hemoglobin 15.2 (*)    All other components within normal limits  URINALYSIS, ROUTINE W REFLEX MICROSCOPIC - Abnormal; Notable for the following components:   Color, Urine AMBER (*)    APPearance CLOUDY (*)    Specific Gravity, Urine 1.003 (*)    All other components within normal limits  I-STAT CHEM 8, ED - Abnormal; Notable for the following components:   BUN <3 (*)    Glucose, Bld 117 (*)    All other components within normal limits  HCG, SERUM, QUALITATIVE  HEPATIC FUNCTION PANEL    EKG: None  Radiology: CT ABDOMEN PELVIS WO CONTRAST Result Date: 05/06/2024 CLINICAL DATA:  Abdominal pain. EXAM: CT ABDOMEN AND PELVIS WITHOUT CONTRAST TECHNIQUE: Multidetector CT imaging of the abdomen and pelvis was performed following the standard protocol without IV contrast. RADIATION DOSE REDUCTION: This exam was performed according to the departmental dose-optimization program which includes automated exposure control, adjustment of the mA and/or kV according to patient size and/or use of iterative reconstruction technique. COMPARISON:  CT abdomen pelvis dated 10/27/2014. FINDINGS: Evaluation of this exam is limited in the absence of intravenous contrast. Lower chest: The visualized lung bases are clear. No intra-abdominal free air or free fluid. Hepatobiliary: The liver is unremarkable. No biliary  dilatation. The gallbladder is unremarkable. Pancreas: Unremarkable. No pancreatic ductal dilatation or surrounding inflammatory changes. Spleen: Normal in size without focal abnormality. Adrenals/Urinary Tract: The left adrenal glands unremarkable. There is an indeterminate 1 cm right adrenal nodule. The kidneys, visualized ureters, and urinary bladder appear unremarkable. Stomach/Bowel: Mild colonic diverticulosis. There is no bowel obstruction or active inflammation. The appendix is not visualized with certainty. No inflammatory changes identified in the right lower quadrant. Vascular/Lymphatic: The abdominal aorta and IVC are grossly unremarkable on this noncontrast CT. No portal venous gas. There is no adenopathy. Reproductive: The uterus is anteverted. No suspicious  adnexal masses. Other: None Musculoskeletal: No acute or significant osseous findings. IMPRESSION: 1. No acute intra-abdominal or pelvic pathology. 2. Mild colonic diverticulosis. No bowel obstruction. Electronically Signed   By: Vanetta Chou M.D.   On: 05/06/2024 13:16     Procedures   Medications Ordered in the ED - No data to display                                  Medical Decision Making Amount and/or Complexity of Data Reviewed Labs: ordered. Radiology: ordered.   48 year old female presents today with vaginal bleeding, frequency of urination Patient evaluated here with labs Due to bleeding patient evaluated with CBC and has a normal hemoglobin. Due to pelvic pain and bleeding patient evaluated with a pregnancy test which is negative Patient evaluated with physical exam that shows no obvious abnormality although there is some bleeding consistent with menstrual cycle CT was obtained to evaluate for any acute intra-abdominal or pelvic pathology. No acute abnormalities were noted Initial urine was obtained as a clean-catch and patient has vaginal bleeding.  There were large number of hemoglobin as well as 0-5 red  blood cells and many bacteria. Squamous epithelial cells were noted In-N-Out cath is being sent repeat cath urine without signs or symptoms of infection Patient appears to be stable for discharge.  She is given referral to GYN and advised to have close follow-up.     Final diagnoses:  Vaginal bleeding  Urinary frequency    ED Discharge Orders     None          Levander Houston, MD 05/06/24 1450

## 2024-05-06 NOTE — Discharge Instructions (Signed)
 Please call the women's health care at [facility].  Please make appointment for follow-up with them. Return if you are having worsening bleeding or other new symptoms.

## 2024-05-06 NOTE — ED Provider Triage Note (Signed)
 Emergency Medicine Provider Triage Evaluation Note  Cheryl Mcconnell , a 48 y.o. female  was evaluated in triage.  Pt complains of lower abdominal pain, vaginal bleeding for 3 weeks and urinary frequency.  Review of Systems  Positive: Urinary frequency, vaginal bleeding Negative: Not sexually active 1.5 years  Physical Exam  BP (!) 128/98 (BP Location: Right Arm)   Pulse (!) 109   Temp 98.9 F (37.2 C) (Oral)   Resp 16   Ht 1.626 m (5' 4)   Wt 90.7 kg   LMP 04/15/2024   SpO2 99%   BMI 34.33 kg/m  Gen:   Awake, no distress appears uncomfortable Resp:  Normal effort  MSK:   Moves extremities without difficulty  Other:  Right sided lower abdominal pain  Medical Decision Making  Medically screening exam initiated at 11:08 AM.  Appropriate orders placed.  [facility] was informed that the remainder of the evaluation will be completed by another provider, this initial triage assessment does not replace that evaluation, and the importance of remaining in the ED until their evaluation is complete.  48 year old female with right lower quadrant pain, vaginal bleeding, describes symptoms concerning for uterine prolapse, and UTI symptoms.  Patient has had appendectomy Plan to place labs, urine test, CT. Will need complete physical exam including vaginal exam and catheterized urine when able to be roomed    Levander Houston, MD 05/06/24 1110
